# Patient Record
Sex: Male | Born: 1975
Health system: Southern US, Community
[De-identification: ages and names within clinical notes are randomized; demographics above are authoritative.]

## PROBLEM LIST (undated history)

## (undated) DIAGNOSIS — T8859XA Other complications of anesthesia, initial encounter: Secondary | ICD-10-CM

## (undated) DIAGNOSIS — F419 Anxiety disorder, unspecified: Secondary | ICD-10-CM

## (undated) DIAGNOSIS — E785 Hyperlipidemia, unspecified: Secondary | ICD-10-CM

## (undated) DIAGNOSIS — Z9889 Other specified postprocedural states: Secondary | ICD-10-CM

## (undated) DIAGNOSIS — R112 Nausea with vomiting, unspecified: Secondary | ICD-10-CM

## (undated) DIAGNOSIS — E119 Type 2 diabetes mellitus without complications: Secondary | ICD-10-CM

## (undated) DIAGNOSIS — T4145XA Adverse effect of unspecified anesthetic, initial encounter: Secondary | ICD-10-CM

## (undated) DIAGNOSIS — I639 Cerebral infarction, unspecified: Secondary | ICD-10-CM

## (undated) HISTORY — DX: Hyperlipidemia, unspecified: E78.5

## (undated) HISTORY — PX: PILONIDAL CYST EXCISION: SHX744

## (undated) HISTORY — DX: Type 2 diabetes mellitus without complications: E11.9

---

## 1999-04-01 ENCOUNTER — Emergency Department (HOSPITAL_COMMUNITY): Admission: EM | Admit: 1999-04-01 | Discharge: 1999-04-01 | Payer: Self-pay | Admitting: Emergency Medicine

## 1999-04-13 ENCOUNTER — Ambulatory Visit (HOSPITAL_BASED_OUTPATIENT_CLINIC_OR_DEPARTMENT_OTHER): Admission: RE | Admit: 1999-04-13 | Discharge: 1999-04-13 | Payer: Self-pay

## 2003-10-22 ENCOUNTER — Emergency Department (HOSPITAL_COMMUNITY): Admission: EM | Admit: 2003-10-22 | Discharge: 2003-10-22 | Payer: Self-pay | Admitting: Emergency Medicine

## 2004-04-01 ENCOUNTER — Emergency Department (HOSPITAL_COMMUNITY): Admission: EM | Admit: 2004-04-01 | Discharge: 2004-04-01 | Payer: Self-pay | Admitting: Emergency Medicine

## 2015-01-17 DIAGNOSIS — E785 Hyperlipidemia, unspecified: Secondary | ICD-10-CM

## 2015-01-17 DIAGNOSIS — E119 Type 2 diabetes mellitus without complications: Secondary | ICD-10-CM

## 2015-01-17 HISTORY — DX: Hyperlipidemia, unspecified: E78.5

## 2015-01-17 HISTORY — DX: Type 2 diabetes mellitus without complications: E11.9

## 2015-02-06 ENCOUNTER — Emergency Department (HOSPITAL_COMMUNITY): Payer: Medicaid Other

## 2015-02-06 ENCOUNTER — Observation Stay (HOSPITAL_COMMUNITY): Payer: Medicaid Other

## 2015-02-06 ENCOUNTER — Inpatient Hospital Stay (HOSPITAL_COMMUNITY)
Admission: EM | Admit: 2015-02-06 | Discharge: 2015-02-08 | DRG: 065 | Disposition: A | Discharge status: Home / Self Care | Payer: Medicaid Other | Attending: Oncology | Admitting: Oncology

## 2015-02-06 ENCOUNTER — Encounter (HOSPITAL_COMMUNITY): Payer: Self-pay | Admitting: Family Medicine

## 2015-02-06 DIAGNOSIS — E669 Obesity, unspecified: Secondary | ICD-10-CM | POA: Diagnosis present

## 2015-02-06 DIAGNOSIS — I639 Cerebral infarction, unspecified: Principal | ICD-10-CM | POA: Diagnosis present

## 2015-02-06 DIAGNOSIS — Z720 Tobacco use: Secondary | ICD-10-CM | POA: Diagnosis not present

## 2015-02-06 DIAGNOSIS — E785 Hyperlipidemia, unspecified: Secondary | ICD-10-CM | POA: Diagnosis present

## 2015-02-06 DIAGNOSIS — G8194 Hemiplegia, unspecified affecting left nondominant side: Secondary | ICD-10-CM | POA: Diagnosis present

## 2015-02-06 DIAGNOSIS — Z6836 Body mass index (BMI) 36.0-36.9, adult: Secondary | ICD-10-CM

## 2015-02-06 DIAGNOSIS — E1165 Type 2 diabetes mellitus with hyperglycemia: Secondary | ICD-10-CM | POA: Diagnosis present

## 2015-02-06 DIAGNOSIS — G459 Transient cerebral ischemic attack, unspecified: Secondary | ICD-10-CM | POA: Diagnosis present

## 2015-02-06 DIAGNOSIS — IMO0002 Reserved for concepts with insufficient information to code with codable children: Secondary | ICD-10-CM | POA: Diagnosis present

## 2015-02-06 DIAGNOSIS — I679 Cerebrovascular disease, unspecified: Secondary | ICD-10-CM | POA: Diagnosis not present

## 2015-02-06 DIAGNOSIS — R29810 Facial weakness: Secondary | ICD-10-CM | POA: Diagnosis present

## 2015-02-06 DIAGNOSIS — Z8673 Personal history of transient ischemic attack (TIA), and cerebral infarction without residual deficits: Secondary | ICD-10-CM | POA: Diagnosis present

## 2015-02-06 DIAGNOSIS — F17210 Nicotine dependence, cigarettes, uncomplicated: Secondary | ICD-10-CM | POA: Diagnosis present

## 2015-02-06 LAB — COMPREHENSIVE METABOLIC PANEL
ALBUMIN: 4.2 g/dL (ref 3.5–5.2)
ALT: 216 U/L — AB (ref 0–53)
AST: 191 U/L — AB (ref 0–37)
Alkaline Phosphatase: 95 U/L (ref 39–117)
Anion gap: 9 (ref 5–15)
BUN: 7 mg/dL (ref 6–23)
CO2: 29 mmol/L (ref 19–32)
CREATININE: 0.75 mg/dL (ref 0.50–1.35)
Calcium: 9.3 mg/dL (ref 8.4–10.5)
Chloride: 102 mmol/L (ref 96–112)
GFR calc Af Amer: >90 mL/min (ref >90)
GFR calc non Af Amer: >90 mL/min (ref >90)
Glucose, Bld: 125 mg/dL — ABNORMAL HIGH (ref 70–99)
Potassium: 3.9 mmol/L (ref 3.5–5.1)
SODIUM: 140 mmol/L (ref 135–145)
TOTAL PROTEIN: 7.6 g/dL (ref 6.0–8.3)
Total Bilirubin: 1 mg/dL (ref 0.3–1.2)

## 2015-02-06 LAB — APTT: aPTT: 27 seconds (ref 24–37)

## 2015-02-06 LAB — PROTIME-INR
INR: 1.07 (ref 0.00–1.49)
Prothrombin Time: 14.1 seconds (ref 11.6–15.2)

## 2015-02-06 LAB — CBC
HCT: 48.4 % (ref 39.0–52.0)
HEMOGLOBIN: 17.3 g/dL — AB (ref 13.0–17.0)
MCH: 32.4 pg (ref 26.0–34.0)
MCHC: 35.7 g/dL (ref 30.0–36.0)
MCV: 90.6 fL (ref 78.0–100.0)
Platelets: 235 10*3/uL (ref 150–400)
RBC: 5.34 MIL/uL (ref 4.22–5.81)
RDW: 12.7 % (ref 11.5–15.5)
WBC: 10.7 10*3/uL — ABNORMAL HIGH (ref 4.0–10.5)

## 2015-02-06 LAB — I-STAT TROPONIN, ED: TROPONIN I, POC: 0 ng/mL (ref 0.00–0.08)

## 2015-02-06 LAB — DIFFERENTIAL
BASOS ABS: 0.1 10*3/uL (ref 0.0–0.1)
Basophils Relative: 1 % (ref 0–1)
EOS PCT: 3 % (ref 0–5)
Eosinophils Absolute: 0.3 10*3/uL (ref 0.0–0.7)
LYMPHS ABS: 2.5 10*3/uL (ref 0.7–4.0)
LYMPHS PCT: 24 % (ref 12–46)
Monocytes Absolute: 0.7 10*3/uL (ref 0.1–1.0)
Monocytes Relative: 6 % (ref 3–12)
NEUTROS ABS: 7.1 10*3/uL (ref 1.7–7.7)
Neutrophils Relative %: 66 % (ref 43–77)

## 2015-02-06 LAB — CBG MONITORING, ED: Glucose-Capillary: 115 mg/dL — ABNORMAL HIGH (ref 70–99)

## 2015-02-06 LAB — ANTITHROMBIN III: ANTITHROMB III FUNC: 104 % (ref 75–120)

## 2015-02-06 MED ORDER — NICOTINE 14 MG/24HR TD PT24
14.0000 mg | MEDICATED_PATCH | Freq: Every day | TRANSDERMAL | Status: DC
  Administered 2015-02-06 – 2015-02-07 (×2): 14 mg via TRANSDERMAL
  Filled 2015-02-06 (×2): qty 1

## 2015-02-06 MED ORDER — ASPIRIN EC 325 MG PO TBEC
325.0000 mg | DELAYED_RELEASE_TABLET | Freq: Every day | ORAL | Status: DC
  Administered 2015-02-06 – 2015-02-08 (×3): 325 mg via ORAL
  Filled 2015-02-06 (×3): qty 1

## 2015-02-06 MED ORDER — ENOXAPARIN SODIUM 40 MG/0.4ML ~~LOC~~ SOLN
40.0000 mg | SUBCUTANEOUS | Status: DC
  Administered 2015-02-06 – 2015-02-07 (×2): 40 mg via SUBCUTANEOUS
  Filled 2015-02-06 (×2): qty 0.4

## 2015-02-06 NOTE — ED Provider Notes (Signed)
CSN: [id number]     Arrival date & time 02/06/15  1208 History   First MD Initiated Contact with Patient 02/06/15 1255     Chief Complaint  Patient presents with  . Extremity Weakness  . Numbness     (Consider location/radiation/quality/duration/timing/severity/associated sxs/prior Treatment) Patient is a 39 y.o. male presenting with extremity weakness. The history is provided by the patient.  Extremity Weakness This is a new problem. The current episode started yesterday. Episode frequency: Intermittently. The problem has not changed since onset.Pertinent negatives include no chest pain, no abdominal pain and no shortness of breath. Nothing aggravates the symptoms. Nothing relieves the symptoms. He has tried nothing for the symptoms.    History reviewed. No pertinent past medical history. History reviewed. No pertinent past surgical history. History reviewed. No pertinent family history. History  Substance Use Topics  . Smoking status: Current Every Day Smoker  . Smokeless tobacco: Not on file  . Alcohol Use: Yes    Review of Systems  Constitutional: Negative for fever and chills.  Respiratory: Negative for cough and shortness of breath.   Cardiovascular: Negative for chest pain and leg swelling.  Gastrointestinal: Negative for vomiting and abdominal pain.  Musculoskeletal: Positive for extremity weakness.  All other systems reviewed and are negative.     Allergies  Review of patient's allergies indicates no known allergies.  Home Medications   Prior to Admission medications   Not on File   BP 134/83 mmHg  Pulse 90  Temp(Src) 97.5 F (36.4 C)  Resp 16  Wt 235 lb (106.595 kg)  SpO2 95% Physical Exam  Constitutional: He is oriented to person, place, and time. He appears well-developed and well-nourished. No distress.  HENT:  Head: Normocephalic and atraumatic.  Mouth/Throat: Oropharynx is clear and moist. No oropharyngeal exudate.  Eyes: EOM are normal. Pupils  are equal, round, and reactive to light.  Neck: Normal range of motion. Neck supple.  Cardiovascular: Normal rate and regular rhythm.  Exam reveals no friction rub.   No murmur heard. Pulmonary/Chest: Effort normal and breath sounds normal. No respiratory distress. He has no wheezes. He has no rales.  Abdominal: Soft. He exhibits no distension. There is no tenderness. There is no rebound.  Musculoskeletal: Normal range of motion. He exhibits no edema.  Neurological: He is alert and oriented to person, place, and time. A cranial nerve deficit (L sided facial droop) and sensory deficit (L arm, L leg, L face) is present. He exhibits abnormal muscle tone (L arm). Coordination normal. GCS eye subscore is 4. GCS verbal subscore is 5. GCS motor subscore is 6.  Skin: No rash noted. He is not diaphoretic.  Nursing note and vitals reviewed.   ED Course  Procedures (including critical care time) Labs Review Labs Reviewed  CBC - Abnormal; Notable for the following:    WBC 10.7 (*)    Hemoglobin 17.3 (*)    All other components within normal limits  COMPREHENSIVE METABOLIC PANEL - Abnormal; Notable for the following:    Glucose, Bld 125 (*)    AST 191 (*)    ALT 216 (*)    All other components within normal limits  PROTIME-INR  APTT  DIFFERENTIAL  I-STAT TROPOININ, ED  CBG MONITORING, ED    Imaging Review Ct Head (brain) Wo Contrast  02/06/2015   CLINICAL DATA:  Left-sided weakness and numbness  EXAM: CT HEAD WITHOUT CONTRAST  TECHNIQUE: Contiguous axial images were obtained from the base of the skull through the vertex  without intravenous contrast.  COMPARISON:  None.  FINDINGS: Bony calvarium is intact. Mucosal retention cysts are noted within the maxillary antra bilaterally. Changes consistent with prior lacunar infarcts are noted in the midline of the pons as well as the right side of the pons. These would correspond with the patient's given clinical history of prior stroke-like symptoms. No  findings to suggest acute hemorrhage, acute infarction or space-occupying mass lesion are noted.  IMPRESSION: Chronic changes as described.  No acute abnormality is noted.   Electronically Signed   By: Alcide CleverMark  Lukens M.D.   On: 02/06/2015 13:28     EKG Interpretation   Date/Time:  Monday February 06 2015 12:13:13 EDT Ventricular Rate:  108 PR Interval:  158 QRS Duration: 78 QT Interval:  326 QTC Calculation: 436 R Axis:   12 Text Interpretation:  Sinus tachycardia Cannot rule out Anterior infarct ,  age undetermined Abnormal ECG No prior for comparison Confirmed by Gwendolyn GrantWALDEN   MD, Travell Desaulniers [phone]) on 02/06/2015 3:33:16 PM      MDM   Final diagnoses:  Transient cerebral ischemia, unspecified transient cerebral ischemia type  Facial droop    39 year old male without any medical problems comes in with symptoms concerning for TIA. He's had intermittent left-sided weakness and left facial droop since yesterday. No pain. No vision issues. No headache. Here vitals are stable. He has left arm weakness. He also has some left-sided facial droop. Sensation is altered on the left leg, left arm, left face. CT head is normal to me, radiology read not yet available. We'll plan for admission, MRI.    Elwin MochaBlair Amarian Botero, MD 02/06/15 1534

## 2015-02-06 NOTE — H&P (Signed)
Date: 02/06/2015               Patient Name:  Bobby Thompson MRN: [id number]  DOB: 09/18/1976 Age / Sex: 39 y.o., male   PCP: No primary care provider on file.         Medical Service: Internal Medicine Teaching Service         Attending Physician: Dr. Levert FeinsteinJames M Granfortuna, MD    First Contact: Tyron RussellKatherine McLaughlin Pager: [phone]  Second Contact: Dr. Mariea ClontsEmokpae Pager: [phone]       After Hours (After 5p/  First Contact Pager: [phone]  weekends / holidays): Second Contact Pager: [phone]   Chief Complaint: Left sided weakness and numbness  History of Present Illness: 39 Y O M with no Past medical history, presented to the Ed today with complaints of left sided numbnes and weakness that started yesterday afternoon, patient says this started with a headache, then progressed to lefts sided facial numbness, with left sided weakness of his upper then his lower extremity. Significant other present at bedside says pt also had slurred speech and left sided facial droop, both of which have improved but still persistent.  Pt denies having difficulty swallowing, as he date last night after symptoms started without any difficulty or choking. He has not had a problems with his gait or walking.  Pt endorsing having similar symptoms similar to the in the past- 2 years ago. He says symptoms where much worse, and involved both sides, but resolved with a few weeks. He does not have a Primary care provider, never seen a doctor, never been told he might have HTN or DM, no hx of blood clots. He denies family hx of premature heart attacks or strokes, but says his father has DM.  Pt also endorses having chest pain- 2 weeks ago, lasted for about 5 hours, constant pressure, without associated SOB, diaphoresis. Also says that he was working at work- works in Production designer, theatre/television/filmMaintenance at [employer]. Pt denies fever, chills, Cough, or SOB. He does not take an aspirin or any other medications. Pt smokes Cigarettes- 3 three- quarter  to a pack per day everyday, for   He denies drug use, and also drinks alcohol 6-8 beers about 3 times a week.  Meds: No current facility-administered medications for this encounter.   No current outpatient prescriptions on file.    Allergies: Allergies as of 02/06/2015  . (No Known Allergies)   History reviewed. No pertinent past medical history. History reviewed. No pertinent past surgical history. History reviewed. No pertinent family history. History   Social History  . Marital Status: Single    Spouse Name: N/A  . Number of Children: N/A  . Years of Education: N/A   Occupational History  . Not on file.   Social History Main Topics  . Smoking status: Current Every Day Smoker  . Smokeless tobacco: Not on file  . Alcohol Use: Yes  . Drug Use: No  . Sexual Activity: Not on file   Other Topics Concern  . Not on file   Social History Narrative  . No narrative on file    Review of Systems: CONSTITUTIONAL- No Fever, or weightloss SKIN- No Rash, colour changes or itching. HEAD- Has a Headache, had some dizziness, but no falls. RESPIRATORY- No Cough or SOB. CARDIAC- No Palpitations, DOE, PND or chest pain. GI- No nausea, vomiting, diarrhoea, constipation, abd pain. URINARY- No Frequency, dysuria. NEUROLOGIC- No Numbness, syncope, seizures or burning.  Physical Exam: Blood pressure 115/60, pulse  83, temperature 97.6 F (36.4 C), resp. rate 17, weight 235 lb (106.595 kg), SpO2 97 %. GENERAL- alert, co-operative, appears as stated age, not in any distress. HEENT- Atraumatic, normocephalic, PERRL, EOMI, oral mucosa appears moist, tongue central, no deviation, neck supple, no carotid bruit appreciated CARDIAC- RRR, no murmurs, rubs or gallops. RESP- Moving equal volumes of air, and clear to auscultation bilaterally, no wheezes or crackles. ABDOMEN- Soft, nontender, no guarding or rebound, no palpable masses or organomegaly, bowel sounds present. BACK- Normal curvature  of the spine, No tenderness along the vertebrae, no CVA tenderness, has some skin tags around neck region. NEURO- Sensation impaired left side of face, arm and leg, but endorses reduced hearing left, Uvular could not be seen, due to pt body habitus, other cranial nerve testing unremarkable, strength upper and lower extremity, mostly 5/5 except for grip strenght- +4/5 on left, Sensation reduced on left, upper extremity, finger to nose intact, rapid alternating movement intact, heel to chin intact. EXTREMITIES- pulse 2+, symmetric, no pedal edema. SKIN- Warm, dry, No rash or lesion. PSYCH- Normal mood and affect, appropriate thought content and speech.  Lab results: Basic Metabolic Panel:  Recent Labs  40/98/11 1224  NA 140  K 3.9  CL 102  CO2 29  GLUCOSE 125*  BUN 7  CREATININE 0.75  CALCIUM 9.3   Liver Function Tests:  Recent Labs  02/06/15 1224  AST 191*  ALT 216*  ALKPHOS 95  BILITOT 1.0  PROT 7.6  ALBUMIN 4.2   CBC:  Recent Labs  02/06/15 1224  WBC 10.7*  NEUTROABS 7.1  HGB 17.3*  HCT 48.4  MCV 90.6  PLT 235   CBG:  Recent Labs  02/06/15 1412  GLUCAP 115*   Coagulation:  Recent Labs  02/06/15 1224  LABPROT 14.1  INR 1.07   Urine Drug Screen: Drugs of Abuse  No results found for: LABOPIA, COCAINSCRNUR, LABBENZ, AMPHETMU, THCU, LABBARB   Imaging results:  Ct Head (brain) Wo Contrast  02/06/2015   CLINICAL DATA:  Left-sided weakness and numbness  EXAM: CT HEAD WITHOUT CONTRAST  TECHNIQUE: Contiguous axial images were obtained from the base of the skull through the vertex without intravenous contrast.  COMPARISON:  None.  FINDINGS: Bony calvarium is intact. Mucosal retention cysts are noted within the maxillary antra bilaterally. Changes consistent with prior lacunar infarcts are noted in the midline of the pons as well as the right side of the pons. These would correspond with the patient's given clinical history of prior stroke-like symptoms. No  findings to suggest acute hemorrhage, acute infarction or space-occupying mass lesion are noted.  IMPRESSION: Chronic changes as described.  No acute abnormality is noted.   Electronically Signed   By: Alcide Clever M.D.   On: 02/06/2015 13:28   Other results: EKG: Rate108bpm, sinus rhythm, regular, PR- 158 ,insignificant Q waves-  lateral leads, QTc- 436, has some J point elevation, but St and t waves basically unremarkable.  Assessment & Plan by Problem: Active Problems:   TIA (transient ischemic attack)  TIA (transient ischemic attack)- Possible TIA as most of pts symptoms have resolved, but with mild persistent paraesthesia. Not TPA candidate with resolving deficits and out side TPA window. Risk factors- So far Obesity and Smoking.  CT shows evidence of prior lacunar infarct- mildline of pons, and right side, without any acute abnormality. Given patient age, and prior history of CVA, will pursue extensive workup including hypercoagulable panel. - Admit to tele, in patient -MRI/MRA head -TEE -fasting  lipid panel -Hga1C -telemetry - Echo -carotid artery dopplers  - Hypercoagulable panel- homocysteine, Antiphospholipid, Anthithrombin, protein c, protein s, total and activity, Lupus anticoag, antithrombin 3, B2 glycoprotein, factor 5 leiden.  -PT/OT consult - Regular diet after until passes RN bed side swallow - Aspirin  daily - Neuro consult- appreciate recs  Tobacco use:  - Nicotine patch -Counselled on cessation  DVT prophylaxis -lovenox  Dispo: Disposition is deferred at this time, awaiting improvement of current medical problems. Anticipated discharge in approximately 2-3 day(s).   The patient does not have a current PCP (No primary care provider on file.) and does need an [facility] hospital follow-up appointment after discharge.  The patient does not have transportation limitations that hinder transportation to clinic appointments.  Signed: Onnie Boer,  MD 02/06/2015, 3:16 PM

## 2015-02-06 NOTE — ED Notes (Signed)
Pt here for left sided numbness and weakness that started yesterday. Pt weaker grip strength on the left and left sided facial droop.

## 2015-02-06 NOTE — H&P (Signed)
Date: 02/06/2015               Patient Name:  Bobby Thompson MRN: [id number]  DOB: 10/29/1976 Age / Sex: 39 y.o., male   PCP: No primary care provider on file.              Medical Service: Internal Medicine Teaching Service              Attending Physician: Dr. Levert FeinsteinJames M Granfortuna, MD    First Contact: Laurey ArrowKatherine Roemer, MS 4 Pager: [phone]  Second Contact: Dr. Mariea ClontsEmokpae Pager: [phone]  Third Contact Dr. Senaida Oresichardson Pager: [phone]       After Hours (After 5p/  First Contact Pager: [phone]  weekends / holidays): Second Contact Pager: [phone]   Chief Complaint: Left sided weakness  History of Present Illness: Patient is a 39 yo man with no past medical history who presents to the ED with a 1 day history of left face, arm, and leg weakness and numbness. Patient states that these symptoms started yesterday morning and were preceded by a headache. Patient describes this headache as "feeling like his head was swollen", and it resolved yesterday. Patient describes weakness and numbness starting at his face and progression down his L arm and leg. He also reports feeling very dizzy, but denies any falls. His wife noticed L facial droop this morning and a change in his speech, although she did not think it was severe enough to be called slurring. He was able to walk and went to work yesterday, but he did note trouble using his left arm. Since yesterday his symptoms have improved, but he reports continued L arm and face weakness and dizziness. Of note, patient describes a similar incident about 2-3 years ago when he had HA, whole body weakness, and dizziness. He did not seek medical care at this time, and reports his symptoms resolved after 1 week.   Patient did had an episode of chest pain and pressure last week, lasting 6 hours and resolving. He denies and sweating or SOB associated with the CP. Patient has had CP once before and reports it was concurrent with his prior episode of body weakness. He  does not have CP with exertion and he is able to climb stairs without difficulty. He does not have a PCP and the last time he saw a doctor was 2 years ago to have a physical exam. He does not take daily aspirin.   Meds: No current facility-administered medications for this encounter.   No current outpatient prescriptions on file.    Allergies: Allergies as of 02/06/2015  . (No Known Allergies)   History reviewed. No pertinent past medical history. History reviewed. No pertinent past surgical history. History reviewed. No pertinent family history. History   Social History  . Marital Status: Single    Spouse Name: N/A  . Number of Children: N/A  . Years of Education: N/A   Occupational History  . Not on file.   Social History Main Topics  . Smoking status: Current Every Day Smoker  . Smokeless tobacco: Not on file  . Alcohol Use: Yes  . Drug Use: No  . Sexual Activity: Not on file   Other Topics Concern  . Not on file   Social History Narrative  . No narrative on file    Review of Systems: Ears, nose, mouth, throat, and face: negative for hearing loss and trouble swallowing Gastrointestinal: negative for abdominal pain and change in bowel habits Genitourinary:negative for trouble  urinating Neurological: positive for dizziness, headaches and weakness, negative for gait problems  Physical Exam: Blood pressure 134/83, pulse 90, temperature 97.5 F (36.4 C), resp. rate 16, weight 106.595 kg (235 lb), SpO2 95 %.  HEENT- Normocephalic. EOM intact. Pupils equal and reactive to light. Cardiovascular- S1, S2 normal, no murmurs appreciated Lungs- CTA bilaterally  Abdomen- soft, non-tender. Normal bowel sounds.  Extremities- no edema Musculoskeletal-no joint tenderness, deformity or swelling Skin-warm and dry,acanthosis nigricans in the neck folds  Neuro: Alert and oriented x 3. 4+ strength LUE and 5 strength in RUE. 5/5 strength in bilateral lower extremities. Slight L  facial droop appreciated when asking patient to smile. Decreased sensation in L face, arm, and leg as compared to right side. Babinski negative bilaterally. Normal finger to nose bilaterally and normal heel-shin test bilaterally.   Lab results: Basic Metabolic Panel:  Recent Labs  16/10/96 1224  NA 140  K 3.9  CL 102  CO2 29  GLUCOSE 125*  BUN 7  CREATININE 0.75  CALCIUM 9.3   Liver Function Tests:  Recent Labs  02/06/15 1224  AST 191*  ALT 216*  ALKPHOS 95  BILITOT 1.0  PROT 7.6  ALBUMIN 4.2   No results for input(s): LIPASE, AMYLASE in the last 72 hours. No results for input(s): AMMONIA in the last 72 hours. CBC:  Recent Labs  02/06/15 1224  WBC 10.7*  NEUTROABS 7.1  HGB 17.3*  HCT 48.4  MCV 90.6  PLT 235   Cardiac Enzymes: No results for input(s): CKTOTAL, CKMB, CKMBINDEX, TROPONINI in the last 72 hours. BNP: No results for input(s): PROBNP in the last 72 hours. D-Dimer: No results for input(s): DDIMER in the last 72 hours. CBG: No results for input(s): GLUCAP in the last 72 hours. Hemoglobin A1C: No results for input(s): HGBA1C in the last 72 hours. Fasting Lipid Panel: No results for input(s): CHOL, HDL, LDLCALC, TRIG, CHOLHDL, LDLDIRECT in the last 72 hours. Thyroid Function Tests: No results for input(s): TSH, T4TOTAL, FREET4, T3FREE, THYROIDAB in the last 72 hours. Anemia Panel: No results for input(s): VITAMINB12, FOLATE, FERRITIN, TIBC, IRON, RETICCTPCT in the last 72 hours. Coagulation:  Recent Labs  02/06/15 1224  LABPROT 14.1  INR 1.07   Urine Drug Screen: Drugs of Abuse  No results found for: LABOPIA, COCAINSCRNUR, LABBENZ, AMPHETMU, THCU, LABBARB  Alcohol Level: No results for input(s): ETH in the last 72 hours. Urinalysis: No results for input(s): COLORURINE, LABSPEC, PHURINE, GLUCOSEU, HGBUR, BILIRUBINUR, KETONESUR, PROTEINUR, UROBILINOGEN, NITRITE, LEUKOCYTESUR in the last 72 hours.  Invalid input(s):  APPERANCEUR   Imaging results:  Ct Head (brain) Wo Contrast  02/06/2015   CLINICAL DATA:  Left-sided weakness and numbness  EXAM: CT HEAD WITHOUT CONTRAST  TECHNIQUE: Contiguous axial images were obtained from the base of the skull through the vertex without intravenous contrast.  COMPARISON:  None.  FINDINGS: Bony calvarium is intact. Mucosal retention cysts are noted within the maxillary antra bilaterally. Changes consistent with prior lacunar infarcts are noted in the midline of the pons as well as the right side of the pons. These would correspond with the patient's given clinical history of prior stroke-like symptoms. No findings to suggest acute hemorrhage, acute infarction or space-occupying mass lesion are noted.  IMPRESSION: Chronic changes as described.  No acute abnormality is noted.   Electronically Signed   By: Alcide Clever M.D.   On: 02/06/2015 13:28    Other results: EKG (3/21): sinus tachycardia, possible anterior infarct of undetermined age. No prior  ekg in system for comparison  Assessment & Plan by Problem: Active Problems:   TIA (transient ischemic attack)  L. Sided Weakness and Numbness: CT shows evidence of prior lacunar infarct, without any acute abnormality. Symptoms are consistent with ischemic stroke, we will proceed with a stroke workup. Given patient's young age it is possible he has a PFO that predisposes him to embolic strokes. -MRI/MRA head -TEE -fasting lipid panel -a1C -telemetry -carotid artery dopplers  - Hypercoagulable panel -PT/OT consult - NPO until passes swallow study - Aspirin  daily - Neuro consult  Tobacco use:  nicotine patch -counsel on cessation  DVT prophylaxis -lovenox  Dispo: Anticipated discharge tomorrow    This is a Psychologist, occupational Note.  The care of the patient was discussed with Dr. Mariea Clonts and the assessment and plan was formulated with their assistance.  Please see their note for official documentation of the patient  encounter.   Signed: Juanetta Gosling, Med Student 02/06/2015, 1:52 PM

## 2015-02-06 NOTE — Consult Note (Signed)
Referring Physician: Cyndie ChimeGranfortuna    Chief Complaint: left sided decreased sensation  HPI:                                                                                                                                         Bobby Thompson is an 39 y.o. male with no significant PMHx other than tobacco abuse. Patient noted yesterday he had new onset of decreased sensation in his left leg, arm and face.  HE did not seek medical attention until today when he noted the symptoms did not dissipate.  He states he has had similar symptoms in the past but never sought medical attention. He does not take ASA daily.   Date last known well: Date: 02/05/2015 Time last known well: Unable to determine tPA Given: No: out of window Modified Rankin: Rankin Score=0    Past medical history--none.  History reviewed. No pertinent past surgical history.  Family history: Negative for stroke as well as family members with significant risk factors for stroke.  Social History:  reports that he has been smoking.  He does not have any smokeless tobacco history on file. He reports that he drinks alcohol. He reports that he does not use illicit drugs.  Allergies: No Known Allergies  Medications:                                                                                                                           No current facility-administered medications for this encounter.   Current Outpatient Prescriptions  Medication Sig Dispense Refill  . ibuprofen (ADVIL,MOTRIN) 200 MG tablet Take 800 mg by mouth daily as needed for headache, mild pain or moderate pain.       ROS:  History obtained from the patient  General ROS: negative for - chills, fatigue, fever, night sweats, weight gain or weight loss Psychological ROS: negative for - behavioral disorder, hallucinations,  memory difficulties, mood swings or suicidal ideation Ophthalmic ROS: negative for - blurry vision, double vision, eye pain or loss of vision ENT ROS: negative for - epistaxis, nasal discharge, oral lesions, sore throat, tinnitus or vertigo Allergy and Immunology ROS: negative for - hives or itchy/watery eyes Hematological and Lymphatic ROS: negative for - bleeding problems, bruising or swollen lymph nodes Endocrine ROS: negative for - galactorrhea, hair pattern changes, polydipsia/polyuria or temperature intolerance Respiratory ROS: negative for - cough, hemoptysis, shortness of breath or wheezing Cardiovascular ROS: negative for - chest pain, dyspnea on exertion, edema or irregular heartbeat Gastrointestinal ROS: negative for - abdominal pain, diarrhea, hematemesis, nausea/vomiting or stool incontinence Genito-Urinary ROS: negative for - dysuria, hematuria, incontinence or urinary frequency/urgency Musculoskeletal ROS: negative for - joint swelling or muscular weakness Neurological ROS: as noted in HPI Dermatological ROS: negative for rash and skin lesion changes  Neurologic Examination:                                                                                                      Blood pressure 111/57, pulse 89, temperature 97.6 F (36.4 C), resp. rate 17, weight 106.595 kg (235 lb), SpO2 93 %.  HEENT-  Normocephalic, no lesions, without obvious abnormality.  Normal external eye and conjunctiva.  Normal TM's bilaterally.  Normal auditory canals and external ears. Normal external nose, mucus membranes and septum.  Normal pharynx. Cardiovascular- S1, S2 normal, pulses palpable throughout   Lungs- chest clear, no wheezing, rales, normal symmetric air entry Abdomen- normal findings: bowel sounds normal Extremities- no edema Lymph-no adenopathy palpable Musculoskeletal-no joint tenderness, deformity or swelling Skin-warm and dry, no hyperpigmentation, vitiligo, or suspicious  lesions  Neurological Examination Mental Status: Alert, oriented, thought content appropriate.  Speech fluent without evidence of aphasia.  Able to follow 3 step commands without difficulty. Cranial Nerves: II: Discs flat bilaterally; Visual fields grossly normal, pupils equal, round, reactive to light and accommodation III,IV, VI: ptosis not present, extra-ocular motions intact bilaterally V,VII: smile asymmetric on the left, facial light touch sensation decreased on the left VIII: hearing normal bilaterally IX,X: uvula rises symmetrically XI: bilateral shoulder shrug XII: midline tongue extension Motor: Right : Upper extremity   5/5    Left:     Upper extremity   5/5  Lower extremity   5/5     Lower extremity   5/5 Tone and bulk:normal tone throughout; no atrophy noted Sensory: Pinprick and light touch decreased on the left Deep Tendon Reflexes: 2+ and symmetric throughout Plantars: Right: downgoing   Left: downgoing Cerebellar: normal finger-to-nose,  and normal heel-to-shin test Gait: Not tested due to safety       Lab Results: Basic Metabolic Panel:  Recent Labs Lab 02/06/15 1224  NA 140  K 3.9  CL 102  CO2 29  GLUCOSE 125*  BUN 7  CREATININE 0.75  CALCIUM 9.3    Liver Function Tests:  Recent Labs Lab 02/06/15 1224  AST 191*  ALT 216*  ALKPHOS 95  BILITOT 1.0  PROT 7.6  ALBUMIN 4.2   No results for input(s): LIPASE, AMYLASE in the last 168 hours. No results for input(s): AMMONIA in the last 168 hours.  CBC:  Recent Labs Lab 02/06/15 1224  WBC 10.7*  NEUTROABS 7.1  HGB 17.3*  HCT 48.4  MCV 90.6  PLT 235    Cardiac Enzymes: No results for input(s): CKTOTAL, CKMB, CKMBINDEX, TROPONINI in the last 168 hours.  Lipid Panel: No results for input(s): CHOL, TRIG, HDL, CHOLHDL, VLDL, LDLCALC in the last 168 hours.  CBG:  Recent Labs Lab 02/06/15 1412  GLUCAP 115*    Microbiology: No results found for this or any previous  visit.  Coagulation Studies:  Recent Labs  02/06/15 1224  LABPROT 14.1  INR 1.07    Imaging: Ct Head (brain) Wo Contrast  02/06/2015   CLINICAL DATA:  Left-sided weakness and numbness  EXAM: CT HEAD WITHOUT CONTRAST  TECHNIQUE: Contiguous axial images were obtained from the base of the skull through the vertex without intravenous contrast.  COMPARISON:  None.  FINDINGS: Bony calvarium is intact. Mucosal retention cysts are noted within the maxillary antra bilaterally. Changes consistent with prior lacunar infarcts are noted in the midline of the pons as well as the right side of the pons. These would correspond with the patient's given clinical history of prior stroke-like symptoms. No findings to suggest acute hemorrhage, acute infarction or space-occupying mass lesion are noted.  IMPRESSION: Chronic changes as described.  No acute abnormality is noted.   Electronically Signed   By: Alcide Clever M.D.   On: 02/06/2015 13:28       Assessment and plan discussed with with attending physician and they are in agreement.    Felicie Morn PA-C [facility] [phone]  02/06/2015, 2:34 PM   Assessment: 39 y.o. male presenting with new onset left sided decreased sensation for <24 hours. Neuro exam shows decreased sensation on the left face, arm and leg. CT head shows no acute stroke but does show bilateral old pontine infarcts.   Stroke Risk Factors - smoking   Recommend: 1. HgbA1c, fasting lipid panel 2. MRI, MRA  of the brain without contrast 3. PT consult, OT consult, Speech consult 4. Echocardiogram 5. Carotid dopplers 6. Prophylactic therapy-Antiplatelet med: Aspirin - dose 325 mg daily 7. Risk factor modification 8. Telemetry monitoring 9. Frequent neuro checks 10 NPO until passes stroke swallow screen 11 hyper coagulable panel.   I personally participated in this patient's evaluation and management, including formulating the above clinical impression and management  recommendations.  Venetia Maxon M.D. [facility] [phone]

## 2015-02-06 NOTE — ED Notes (Signed)
CBG 119 

## 2015-02-07 DIAGNOSIS — I679 Cerebrovascular disease, unspecified: Secondary | ICD-10-CM

## 2015-02-07 DIAGNOSIS — I639 Cerebral infarction, unspecified: Principal | ICD-10-CM

## 2015-02-07 LAB — LIPID PANEL
Cholesterol: 182 mg/dL (ref 0–200)
HDL: 25 mg/dL — AB (ref >39)
LDL CALC: 80 mg/dL (ref 0–99)
Total CHOL/HDL Ratio: 7.3 RATIO
Triglycerides: 387 mg/dL — ABNORMAL HIGH (ref <150)
VLDL: 77 mg/dL — ABNORMAL HIGH (ref 0–40)

## 2015-02-07 MED ORDER — PERFLUTREN LIPID MICROSPHERE
1.0000 mL | INTRAVENOUS | Status: AC | PRN
Start: 2015-02-07 — End: 2015-02-07
  Administered 2015-02-07: 3 mL via INTRAVENOUS
  Filled 2015-02-07: qty 10

## 2015-02-07 MED ORDER — ATORVASTATIN CALCIUM 10 MG PO TABS
20.0000 mg | ORAL_TABLET | Freq: Every day | ORAL | Status: DC
  Administered 2015-02-07: 20 mg via ORAL
  Filled 2015-02-07: qty 2

## 2015-02-07 NOTE — Progress Notes (Signed)
Subjective: Patient continued to have L sided numbness and residual weakness. Has not be up and out of bed other than to go to the bathroom. NAEO.   Objective: Vital signs in last 24 hours: Filed Vitals:   02/07/15 0130 02/07/15 0330 02/07/15 0530 02/07/15 0730  BP: 106/62 111/60 117/70 112/60  Pulse: 73 78 73 80  Temp: 97.9 F (36.6 C) 98.1 F (36.7 C) 97.9 F (36.6 C) 98.4 F (36.9 C)  TempSrc: Oral Oral Oral Oral  Resp: 18 18 18 16   Height:      Weight:      SpO2: 97% 96% 98% 96%   Weight change:   Intake/Output Summary (Last 24 hours) at 02/07/15 0809 Last data filed at 02/06/15 2030  Gross per 24 hour  Intake    240 ml  Output      0 ml  Net    240 ml   GENERAL- alert, co-operative, appears as stated age, not in any distress. HEENT- Atraumatic, normocephalic, PERRL, EOMI, oral mucosa appears moist, tongue central, no deviation, neck supple, no carotid bruit appreciated CARDIAC- RRR, no murmurs, rubs or gallops. RESP- Moving equal volumes of air, and clear to auscultation bilaterally, no wheezes or crackles. ABDOMEN- Soft, nontender, no guarding or rebound, no palpable masses or organomegaly, bowel sounds present. BACK- Normal curvature of the spine, No tenderness along the vertebrae, no CVA tenderness, has some skin tags around neck region. NEURO- Sensation impaired left side of face, arm and leg, but endorses reduced hearing left, Uvular could not be seen, due to pt body habitus, other cranial nerve testing unremarkable, strength upper and lower extremity, mostly 5/5 except for grip strenght- +4/5 on left, Sensation reduced on left, upper extremity, finger to nose intact, rapid alternating movement intact, heel to chin intact. EXTREMITIES- pulse 2+, symmetric, no pedal edema. SKIN- Warm, dry, No rash or lesion. PSYCH- Normal mood and affect, appropriate thought content and speech.  Lab Results: Basic Metabolic Panel:  Recent Labs  16/10/96 1224  NA 140  K 3.9    CL 102  CO2 29  GLUCOSE 125*  BUN 7  CREATININE 0.75  CALCIUM 9.3   Liver Function Tests:  Recent Labs  02/06/15 1224  AST 191*  ALT 216*  ALKPHOS 95  BILITOT 1.0  PROT 7.6  ALBUMIN 4.2   CBC:  Recent Labs  02/06/15 1224  WBC 10.7*  NEUTROABS 7.1  HGB 17.3*  HCT 48.4  MCV 90.6  PLT 235   CBG:  Recent Labs  02/06/15 1412  GLUCAP 115*   Hemoglobin A1C: No results for input(s): HGBA1C in the last 72 hours. Fasting Lipid Panel:  Recent Labs  02/07/15 0700  CHOL 182  HDL 25*  LDLCALC 80  TRIG 045*  CHOLHDL 7.3  Coagulation:  Recent Labs  02/06/15 1224  LABPROT 14.1  INR 1.07   Urinalysis: No results for input(s): COLORURINE, LABSPEC, PHURINE, GLUCOSEU, HGBUR, BILIRUBINUR, KETONESUR, PROTEINUR, UROBILINOGEN, NITRITE, LEUKOCYTESUR in the last 72 hours.  Invalid input(s): APPERANCEUR   Micro Results: No results found for this or any previous visit (from the past 240 hour(s)). Studies/Results: Ct Head (brain) Wo Contrast  02/06/2015   CLINICAL DATA:  Left-sided weakness and numbness  EXAM: CT HEAD WITHOUT CONTRAST  TECHNIQUE: Contiguous axial images were obtained from the base of the skull through the vertex without intravenous contrast.  COMPARISON:  None.  FINDINGS: Bony calvarium is intact. Mucosal retention cysts are noted within the maxillary antra bilaterally. Changes consistent with  prior lacunar infarcts are noted in the midline of the pons as well as the right side of the pons. These would correspond with the patient's given clinical history of prior stroke-like symptoms. No findings to suggest acute hemorrhage, acute infarction or space-occupying mass lesion are noted.  IMPRESSION: Chronic changes as described.  No acute abnormality is noted.   Electronically Signed   By: Alcide Clever M.D.   On: 02/06/2015 13:28   Mr [name] Head Wo Contrast  02/06/2015   CLINICAL DATA:  39 year old male who awoke with left side numbness today. TIA. Initial  encounter.  EXAM: MRI HEAD WITHOUT CONTRAST  MRA HEAD WITHOUT CONTRAST  TECHNIQUE: Multiplanar, multiecho pulse sequences of the brain and surrounding structures were obtained without intravenous contrast. Angiographic images of the head were obtained using MRA technique without contrast.  COMPARISON:  CT head without contrast 1250 hours today.  FINDINGS: MRI HEAD FINDINGS  There is confluent restricted diffusion in the right paracentral pons with corresponding T2 and FLAIR hyperintensity. This is just lateral and cephalad to a chronic more central pontine lacunar infarct. No associated hemorrhage or significant mass effect.  No other restricted diffusion. Major intracranial vascular flow voids are preserved. However, there is enlargement of the basilar artery tip measuring up to 6-7 mm. See MRA findings below.  No other posterior fossa chronic ischemic disease. Supratentorial gray and white matter signal is within normal limits. No midline shift, mass effect, evidence of mass lesion, ventriculomegaly, extra-axial collection or acute intracranial hemorrhage. Cervicomedullary junction and pituitary are within normal limits. Negative visualized cervical spine.  Visible internal auditory structures appear normal. Mastoids are clear. Left greater than right maxillary sinus mucous retention cyst. Visualized orbit soft tissues are within normal limits. Negative scalp soft tissues. Bone marrow signal within normal limits.  MRA HEAD FINDINGS  Antegrade flow in the distal vertebral arteries, the left is mildly dominant. The distal right vertebral artery is diminutive beyond the patent right PICA origin. No distal vertebral artery stenosis. Patent vertebrobasilar junction. Mild basilar artery irregularity with no stenosis.  Heterogeneous flow signal within the distal basilar artery compatible with an up to 8 mm basilar tip aneurysm involving the PCA origins, more so the left. Posterior communicating arteries are diminutive or  absent. Bilateral PCA branches remain within normal limits.  Antegrade flow in both ICA siphons. No siphon stenosis. Ophthalmic artery origins are within normal limits. Normal carotid termini, MCA and ACA origins. Anterior communicating artery and visualized ACA branches within normal limits. Visualized bilateral MCA branches are within normal limits.  IMPRESSION: 1. Acute on chronic brainstem ischemia; Acute right paracentral pons lacunar infarct. No associated mass effect or hemorrhage. 2. Basilar tip aneurysm, 8 mm. Mild basilar artery irregularity elsewhere, no posterior circulation stenosis or major circle of Willis branch occlusion identified.   Electronically Signed   By: Odessa Fleming M.D.   On: 02/06/2015 18:21   Mr Brain Wo Contrast  02/06/2015   CLINICAL DATA:  39 year old male who awoke with left side numbness today. TIA. Initial encounter.  EXAM: MRI HEAD WITHOUT CONTRAST  MRA HEAD WITHOUT CONTRAST  TECHNIQUE: Multiplanar, multiecho pulse sequences of the brain and surrounding structures were obtained without intravenous contrast. Angiographic images of the head were obtained using MRA technique without contrast.  COMPARISON:  CT head without contrast 1250 hours today.  FINDINGS: MRI HEAD FINDINGS  There is confluent restricted diffusion in the right paracentral pons with corresponding T2 and FLAIR hyperintensity. This is just lateral and cephalad to  a chronic more central pontine lacunar infarct. No associated hemorrhage or significant mass effect.  No other restricted diffusion. Major intracranial vascular flow voids are preserved. However, there is enlargement of the basilar artery tip measuring up to 6-7 mm. See MRA findings below.  No other posterior fossa chronic ischemic disease. Supratentorial gray and white matter signal is within normal limits. No midline shift, mass effect, evidence of mass lesion, ventriculomegaly, extra-axial collection or acute intracranial hemorrhage. Cervicomedullary  junction and pituitary are within normal limits. Negative visualized cervical spine.  Visible internal auditory structures appear normal. Mastoids are clear. Left greater than right maxillary sinus mucous retention cyst. Visualized orbit soft tissues are within normal limits. Negative scalp soft tissues. Bone marrow signal within normal limits.  MRA HEAD FINDINGS  Antegrade flow in the distal vertebral arteries, the left is mildly dominant. The distal right vertebral artery is diminutive beyond the patent right PICA origin. No distal vertebral artery stenosis. Patent vertebrobasilar junction. Mild basilar artery irregularity with no stenosis.  Heterogeneous flow signal within the distal basilar artery compatible with an up to 8 mm basilar tip aneurysm involving the PCA origins, more so the left. Posterior communicating arteries are diminutive or absent. Bilateral PCA branches remain within normal limits.  Antegrade flow in both ICA siphons. No siphon stenosis. Ophthalmic artery origins are within normal limits. Normal carotid termini, MCA and ACA origins. Anterior communicating artery and visualized ACA branches within normal limits. Visualized bilateral MCA branches are within normal limits.  IMPRESSION: 1. Acute on chronic brainstem ischemia; Acute right paracentral pons lacunar infarct. No associated mass effect or hemorrhage. 2. Basilar tip aneurysm, 8 mm. Mild basilar artery irregularity elsewhere, no posterior circulation stenosis or major circle of Willis branch occlusion identified.   Electronically Signed   By: Odessa FlemingH  Hall M.D.   On: 02/06/2015 18:21   Medications: I have reviewed the patient's current medications. Scheduled Meds: . aspirin EC  325 mg Oral Daily  . enoxaparin (LOVENOX) injection  40 mg Subcutaneous Q24H  . nicotine  14 mg Transdermal Daily   Continuous Infusions:  PRN Meds:. Assessment/Plan: Principal Problem:   CVA (cerebral vascular accident) Active Problems:   TIA (transient  ischemic attack)   Stroke TIA (transient ischemic attack)- Possible TIA as most of pts symptoms have resolved, but with mild persistent paraesthesia. Not TPA candidate with resolving deficits and out side TPA window. Risk factors- So far Obesity and Smoking. CT shows evidence of prior lacunar infarct- mildline of pons, and right side, without any acute abnormality. MRI/MRA with evidence of acute pontine infart and basilar artery dilatation. Given patient age, and prior history of CVA, will pursue extensive workup including hypercoagulable panel. WIth Hb of 17.3 will consider P vera and other myeloproliferative diseases.  -TEE today -fasting lipid panel: Try 387, LDL 80; tot chol 182 -Hga1C pending -telemetry -carotid artery dopplers  - Hypercoagulable panel- homocysteine, Antiphospholipid, Anthithrombin, protein c, protein s, total and activity, Lupus anticoag, antithrombin 3, B2 glycoprotein, factor 5 leiden pending - Jak2 genotype pending - Carboxy Hb  -PT/OT consult - Aspirin 325mg  daily - Neuro consult- appreciate recs  Tobacco use:  - Nicotine patch -Counselled on cessation  DVT prophylaxis -lovenox  Dispo: Disposition is deferred at this time, awaiting improvement of current medical problems. Anticipated discharge in approximately 2-3 day(s).   This is a Psychologist, occupationalMedical Student Note.  The care of the patient was discussed with Dr. Cyndie ChimeGranfortuna and the assessment and plan formulated with their assistance.  Please see their  attached note for official documentation of the daily encounter.   LOS: 1 day   Juanetta Gosling, Med Student 02/07/2015, 8:09 AM

## 2015-02-07 NOTE — Progress Notes (Signed)
UR complete.  Brielle Moro RN, MSN 

## 2015-02-07 NOTE — Progress Notes (Signed)
  Echocardiogram 2D Echocardiogram with Definity has been performed.  Bobby BeamsGREGORY, Bobby Thompson 02/07/2015, 2:54 PM

## 2015-02-07 NOTE — Evaluation (Addendum)
Physical Therapy Evaluation Patient Details Name: Bobby Thompson MRN: [id number] DOB: September 08, 1976 Today's Date: 02/07/2015   History of Present Illness  adm with Lt sided numbness; MRI + acute on chronic brainstem ischemia; + Rt pons infarct; 8 mm aneurysm basilar artery  PMHx- tobacco user  Clinical Impression  Pt admitted with above diagnosis. Pt reporting mild vertigo/spinning that is present constantly (slightly worse when mobilizing).  Pt currently with functional limitations due to the deficits listed below (see PT Problem List). 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  Pt will benefit from skilled PT to increase their independence and safety with mobility to allow discharge to the venue listed below.       Follow Up Recommendations Outpatient PT;Supervision - Intermittent    Equipment Recommendations  None recommended by PT    Recommendations for Other Services OT consult     Precautions / Restrictions Precautions Precautions: Fall      Mobility  Bed Mobility Overal bed mobility: Needs Assistance Bed Mobility: Rolling;Sidelying to Sit Rolling: Modified independent (Device/Increase time) Sidelying to sit: Min guard       General bed mobility comments: HOB 0, + rail; nearly fell back down to sidelying as he struggle to push up/sit up from Lt sidelying, near need for assist  Transfers Overall transfer level: Needs assistance Equipment used: None Transfers: Sit to/from Stand Sit to Stand: Supervision         General transfer comment: no unsteadiness: + spinning  Ambulation/Gait Ambulation/Gait assistance: Min guard;Supervision Ambulation Distance (Feet): 300 Feet Assistive device: None Gait Pattern/deviations: Step-through pattern;Drifts right/left Gait velocity: able to vary up/down Gait velocity interpretation: Below normal speed for age/gender General  Gait Details: slightly guarded gait; pt reports environment is spinning continuously; able to maintain straight path when forward looking; with head turns he veers off the path  Stairs Stairs: Yes Stairs assistance: Min guard Stair Management: No rails;Forwards;Alternating pattern Number of Stairs: 5 General stair comments: no difficulty although he reports he can feel Lt knee weakness   Wheelchair Mobility    Modified Rankin (Stroke Patients Only) Modified Rankin (Stroke Patients Only) Pre-Morbid Rankin Score: No symptoms Modified Rankin: Moderately severe disability     Balance Overall balance assessment: Needs assistance                               Standardized Balance Assessment Standardized Balance Assessment : Berg Balance Test;Dynamic Gait Index Berg Balance Test Sit to Stand: Able to stand without using hands and stabilize independently Standing Unsupported: Able to stand safely 2 minutes Sitting with Back Unsupported but Feet Supported on Floor or Stool: Able to sit safely and securely 2 minutes Stand to Sit: Sits safely with minimal use of hands Transfers: Able to transfer safely, minor use of hands Standing Unsupported with Eyes Closed: Able to stand 10 seconds safely Standing Ubsupported with Feet Together: Able to place feet together independently and stand 1 minute safely From Standing, Reach Forward with Outstretched Arm: Can reach confidently >25 cm (10") From Standing Position, Pick up Object from Floor: Able to pick up shoe safely and easily From Standing Position, Turn to Look Behind Over each Shoulder: Looks behind from both sides and weight shifts well Turn 360 Degrees: Able to turn 360 degrees safely one side only in 4 seconds or less Standing Unsupported, Alternately Place Feet on Step/Stool: Able to  stand independently and complete 8 steps >20 seconds Standing Unsupported, One Foot in Front: Able to plae foot ahead of the other independently  and hold 30 seconds Standing on One Leg: Tries to lift leg/unable to hold 3 seconds but remains standing independently Total Score: 50 Dynamic Gait Index Level Surface: Normal Change in Gait Speed: Mild Impairment Gait with Horizontal Head Turns: Mild Impairment Gait with Vertical Head Turns: Mild Impairment Gait and Pivot Turn: Normal Step Over Obstacle: Mild Impairment Step Around Obstacles: Mild Impairment Steps: Normal Total Score: 19       Pertinent Vitals/Pain Pain Assessment: No/denies pain    Home Living Family/patient expects to be discharged to:: Private residence Living Arrangements: Spouse/significant other;Children (13, 3, 8 months) Available Help at Discharge: Family;Available 24 hours/day Type of Home: Mobile home Home Access: Stairs to enter Entrance Stairs-Rails: Left Entrance Stairs-Number of Steps: 4 Home Layout: One level Home Equipment: None      Prior Function Level of Independence: Independent         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[employer]     Hand Dominance   Dominant Hand: Right    Extremity/Trunk Assessment   Upper Extremity Assessment: Defer to OT evaluation           Lower Extremity Assessment: LLE deficits/detail   LLE Deficits / Details: knee extension 4/5; dorsiflexion 4/5  Cervical / Trunk Assessment: Other exceptions  Communication   Communication: No difficulties  Cognition Arousal/Alertness: Awake/alert Behavior During Therapy: WFL for tasks assessed/performed Overall Cognitive Status: Within Functional Limits for tasks assessed                      General Comments      Exercises        Assessment/Plan    PT Assessment Patient needs continued PT services  PT Diagnosis Hemiplegia non-dominant side;Difficulty walking   PT Problem List Decreased strength;Decreased balance;Decreased coordination;Impaired sensation  PT Treatment Interventions Gait training;Stair training;Functional mobility  training;Therapeutic activities;Balance training;Neuromuscular re-education;Patient/family education   PT Goals (Current goals can be found in the Care Plan section) Acute Rehab PT Goals Patient Stated Goal: ultimately return to work (when safe) PT Goal Formulation: With patient Time For Goal Achievement: 02/10/15 Potential to Achieve Goals: Good    Frequency Min 4X/week   Barriers to discharge        Co-evaluation               End of Session Equipment Utilized During Treatment: Gait belt Activity Tolerance: Patient tolerated treatment well Patient left: in chair;with call bell/phone within reach;with family/visitor present Nurse Communication: Mobility status         Time: [id number] PT Time Calculation (min) (ACUTE ONLY): 28 min   Charges:   PT Evaluation $Initial PT Evaluation Tier I: 1 Procedure PT Treatments $Gait Training: 8-22 mins   PT G Codes:        Finian Helvey 02/07/2015, 11:05 AM Pager [phone]

## 2015-02-07 NOTE — Progress Notes (Signed)
STROKE TEAM PROGRESS NOTE   HISTORY Bobby Thompson is an 39 y.o. male with no significant PMHx other than tobacco abuse. Patient noted yesterday 02/05/2015 (time unknown) he had new onset of decreased sensation in his left leg, arm and face. He did not seek medical attention until today 02/05/2105 when he noted the symptoms did not dissipate. He states he has had similar symptoms in the past but never sought medical attention. He does not take ASA daily. Patient was not administered TPA secondary to delay in arrival. He was admitted for further evaluation and treatment.   SUBJECTIVE (INTERVAL HISTORY) His wife is at the bedside.  Overall he feels his condition is stable.    OBJECTIVE Temp:  [97.6 F (36.4 C)-99.3 F (37.4 C)] 99.3 F (37.4 C) (03/22 0941) Pulse Rate:  [73-93] 87 (03/22 0941) Cardiac Rhythm:  [-] Normal sinus rhythm (03/21 2000) Resp:  [12-18] 16 (03/22 0941) BP: (106-141)/(57-83) 132/77 mmHg (03/22 0941) SpO2:  [93 %-98 %] 97 % (03/22 0941)   Recent Labs Lab 02/06/15 1412  GLUCAP 115*    Recent Labs Lab 02/06/15 1224  NA 140  K 3.9  CL 102  CO2 29  GLUCOSE 125*  BUN 7  CREATININE 0.75  CALCIUM 9.3    Recent Labs Lab 02/06/15 1224  AST 191*  ALT 216*  ALKPHOS 95  BILITOT 1.0  PROT 7.6  ALBUMIN 4.2    Recent Labs Lab 02/06/15 1224  WBC 10.7*  NEUTROABS 7.1  HGB 17.3*  HCT 48.4  MCV 90.6  PLT 235   No results for input(s): CKTOTAL, CKMB, CKMBINDEX, TROPONINI in the last 168 hours.  Recent Labs  02/06/15 1224  LABPROT 14.1  INR 1.07   No results for input(s): COLORURINE, LABSPEC, PHURINE, GLUCOSEU, HGBUR, BILIRUBINUR, KETONESUR, PROTEINUR, UROBILINOGEN, NITRITE, LEUKOCYTESUR in the last 72 hours.  Invalid input(s): APPERANCEUR     Component Value Date/Time   CHOL 182 02/07/2015 0700   TRIG 387* 02/07/2015 0700   HDL 25* 02/07/2015 0700   CHOLHDL 7.3 02/07/2015 0700   VLDL 77* 02/07/2015 0700   LDLCALC 80 02/07/2015 0700    No results found for: HGBA1C No results found for: LABOPIA, COCAINSCRNUR, LABBENZ, AMPHETMU, THCU, LABBARB  No results for input(s): ETH in the last 168 hours.  Ct Head (brain) Wo Contrast  02/06/2015   CLINICAL DATA:  Left-sided weakness and numbness  EXAM: CT HEAD WITHOUT CONTRAST  TECHNIQUE: Contiguous axial images were obtained from the base of the skull through the vertex without intravenous contrast.  COMPARISON:  None.  FINDINGS: Bony calvarium is intact. Mucosal retention cysts are noted within the maxillary antra bilaterally. Changes consistent with prior lacunar infarcts are noted in the midline of the pons as well as the right side of the pons. These would correspond with the patient's given clinical history of prior stroke-like symptoms. No findings to suggest acute hemorrhage, acute infarction or space-occupying mass lesion are noted.  IMPRESSION: Chronic changes as described.  No acute abnormality is noted.   Electronically Signed   By: Alcide CleverMark  Lukens M.D.   On: 02/06/2015 13:28   Mr Maxine GlennMra Head Wo Contrast  02/06/2015   CLINICAL DATA:  39 year old male who awoke with left side numbness today. TIA. Initial encounter.  EXAM: MRI HEAD WITHOUT CONTRAST  MRA HEAD WITHOUT CONTRAST  TECHNIQUE: Multiplanar, multiecho pulse sequences of the brain and surrounding structures were obtained without intravenous contrast. Angiographic images of the head were obtained using MRA technique without contrast.  COMPARISON:  CT head without contrast 1250 hours today.  FINDINGS: MRI HEAD FINDINGS  There is confluent restricted diffusion in the right paracentral pons with corresponding T2 and FLAIR hyperintensity. This is just lateral and cephalad to a chronic more central pontine lacunar infarct. No associated hemorrhage or significant mass effect.  No other restricted diffusion. Major intracranial vascular flow voids are preserved. However, there is enlargement of the basilar artery tip measuring up to 6-7 mm. See  MRA findings below.  No other posterior fossa chronic ischemic disease. Supratentorial gray and white matter signal is within normal limits. No midline shift, mass effect, evidence of mass lesion, ventriculomegaly, extra-axial collection or acute intracranial hemorrhage. Cervicomedullary junction and pituitary are within normal limits. Negative visualized cervical spine.  Visible internal auditory structures appear normal. Mastoids are clear. Left greater than right maxillary sinus mucous retention cyst. Visualized orbit soft tissues are within normal limits. Negative scalp soft tissues. Bone marrow signal within normal limits.  MRA HEAD FINDINGS  Antegrade flow in the distal vertebral arteries, the left is mildly dominant. The distal right vertebral artery is diminutive beyond the patent right PICA origin. No distal vertebral artery stenosis. Patent vertebrobasilar junction. Mild basilar artery irregularity with no stenosis.  Heterogeneous flow signal within the distal basilar artery compatible with an up to 8 mm basilar tip aneurysm involving the PCA origins, more so the left. Posterior communicating arteries are diminutive or absent. Bilateral PCA branches remain within normal limits.  Antegrade flow in both ICA siphons. No siphon stenosis. Ophthalmic artery origins are within normal limits. Normal carotid termini, MCA and ACA origins. Anterior communicating artery and visualized ACA branches within normal limits. Visualized bilateral MCA branches are within normal limits.  IMPRESSION: 1. Acute on chronic brainstem ischemia; Acute right paracentral pons lacunar infarct. No associated mass effect or hemorrhage. 2. Basilar tip aneurysm, 8 mm. Mild basilar artery irregularity elsewhere, no posterior circulation stenosis or major circle of Willis branch occlusion identified.   Electronically Signed   By: Odessa Fleming M.D.   On: 02/06/2015 18:21   Mr Brain Wo Contrast  02/06/2015   CLINICAL DATA:  39 year old male who  awoke with left side numbness today. TIA. Initial encounter.  EXAM: MRI HEAD WITHOUT CONTRAST  MRA HEAD WITHOUT CONTRAST  TECHNIQUE: Multiplanar, multiecho pulse sequences of the brain and surrounding structures were obtained without intravenous contrast. Angiographic images of the head were obtained using MRA technique without contrast.  COMPARISON:  CT head without contrast 1250 hours today.  FINDINGS: MRI HEAD FINDINGS  There is confluent restricted diffusion in the right paracentral pons with corresponding T2 and FLAIR hyperintensity. This is just lateral and cephalad to a chronic more central pontine lacunar infarct. No associated hemorrhage or significant mass effect.  No other restricted diffusion. Major intracranial vascular flow voids are preserved. However, there is enlargement of the basilar artery tip measuring up to 6-7 mm. See MRA findings below.  No other posterior fossa chronic ischemic disease. Supratentorial gray and white matter signal is within normal limits. No midline shift, mass effect, evidence of mass lesion, ventriculomegaly, extra-axial collection or acute intracranial hemorrhage. Cervicomedullary junction and pituitary are within normal limits. Negative visualized cervical spine.  Visible internal auditory structures appear normal. Mastoids are clear. Left greater than right maxillary sinus mucous retention cyst. Visualized orbit soft tissues are within normal limits. Negative scalp soft tissues. Bone marrow signal within normal limits.  MRA HEAD FINDINGS  Antegrade flow in the distal vertebral arteries, the left is mildly dominant.  The distal right vertebral artery is diminutive beyond the patent right PICA origin. No distal vertebral artery stenosis. Patent vertebrobasilar junction. Mild basilar artery irregularity with no stenosis.  Heterogeneous flow signal within the distal basilar artery compatible with an up to 8 mm basilar tip aneurysm involving the PCA origins, more so the left.  Posterior communicating arteries are diminutive or absent. Bilateral PCA branches remain within normal limits.  Antegrade flow in both ICA siphons. No siphon stenosis. Ophthalmic artery origins are within normal limits. Normal carotid termini, MCA and ACA origins. Anterior communicating artery and visualized ACA branches within normal limits. Visualized bilateral MCA branches are within normal limits.  IMPRESSION: 1. Acute on chronic brainstem ischemia; Acute right paracentral pons lacunar infarct. No associated mass effect or hemorrhage. 2. Basilar tip aneurysm, 8 mm. Mild basilar artery irregularity elsewhere, no posterior circulation stenosis or major circle of Willis branch occlusion identified.   Electronically Signed   By: Odessa Fleming M.D.   On: 02/06/2015 18:21     PHYSICAL EXAM  obese young Hispanic male currently not in distress. . Afebrile. Head is nontraumatic. Neck is supple without bruit.    Cardiac exam no murmur or gallop. Lungs are clear to auscultation. Distal pulses are well felt. Neurological Exam : Awake alert oriented 3 with normal speech and language function. Fundi were not visualized. Vision acuity seems adequate. Extraocular movements are full range but there is saccadic dysmetria on horizontal gaze ( left greater than right). No nystagmus. Face is symmetric without weakness. Tongue is midline. Motor system exam revealed no upper or lower eczema to drift but mild weakness of left grip and intrinsic hand muscles. Orbits right over left upper extremity. Minimum weakness of left hip flexors and ankle dorsiflexors. Subjective decrease left hemibody sensation. Mild impaired left finger-to-nose coordination. Gait was not tested. Plantars are downgoing.  ASSESSMENT/PLAN [name] is a 39 y.o. male with no real medical history other than smoking who presents with left sided weakness and numbness. He did not receive IV t-PA due to delay in arrival.   Stroke:  Non-dominant right  paracentral pontine lacunar infarct secondary to small vessel disease source  Resultant  Left hand clusiness  MRI  Acute R paracentral pontine lacunar infarct  MRA  8mm basilar tip aneurysm   Carotid Doppler  pending   2D Echo  pending   HgbA1c pending  Hypercoagulable labs pending. Antithrombin III normal.  Lovenox 40 mg sq daily for VTE prophylaxis  Diet regular thin liquids  no antithrombotic prior to admission, now on aspirin 325 mg orally every day  Patient counseled to be compliant with his antithrombotic medications  Ongoing aggressive stroke risk factor management  Therapy recommendations:  OP PT. Will likely need OT as well.  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 Needs to improve prior to returning to work.  Basilar tip aneurysm  Incidental finding  Monitor over time  Consider intervention in the future  For now, Stop smoking, keep BP under control, avoid etoh  Risk of rupture 1% per year  Follow up with neurology Pearlean Brownie) in 2 months  Hyperlipidemia  Home meds:  No statin  LDL 80, goal < 70  New lipitor 20 added  Continue statin at discharge  Elevated LFTs AST 191*  ALT 216*    Other Stroke Risk Factors  Cigarette smoker, advised to stop smoking  ETOH use  Obesity, Body mass index is 36.8 kg/(m^2).   Hospital  day # 1  [facility] See Amion for Pager information 02/07/2015 12:42 PM  I have personally examined this patient, reviewed notes, independently viewed imaging studies, participated in medical decision making and plan of care. I have made any additions or clarifications directly to the above note. Agree with note above. He has a pontine infarct most likely due to small vessel disease and an incidental small basilar tip aneurysm. He remains at risk for recurrent strokes, TIAs, neurological worsening and needs ongoing stroke evaluation and aggressive risk  factor modification. We'll discuss treatment options for asymptomatic aneurysms which I do not think is related to his current stroke and will address this upon follow-up  Delia Heady, MD Medical Director [facility] Pager: [phone] 02/07/2015 2:53 PM    To contact Stroke Continuity provider, please refer to [email or web address]. After hours, contact General Neurology

## 2015-02-07 NOTE — Progress Notes (Signed)
Patient arrive to 4N12 at 1900 alert and oriented no complaints.

## 2015-02-07 NOTE — Evaluation (Signed)
Occupational Therapy Evaluation Patient Details Name: Bobby Thompson MRN: [id number] DOB: 11/15/1976 Today's Date: 02/07/2015    History of Present Illness adm with Lt sided numbness; MRI + acute on chronic brainstem ischemia; + Rt pons infarct; 8 mm aneurysm basilar artery  PMHx- tobacco user   Clinical Impression   Pt admitted with the above diagnoses and presents with below problem list. Pt will benefit from continued acute OT to address the below listed deficits and maximize independence with BADLs prior to d/c home with family. Pt presents with Lt side weakness and decreased sensation on Lt side. Pt noted to have some difficulty maintaining gaze to the left and superior fields. Decreased reaction time and dynamic balance impacting level with ADLs. PTA pt was independent with ADLs, worked in maintenance and drove. Pt currently at min guard level for LB ADLs and functional mobility due to safety with balance. OT to continue to follow acutely.     Follow Up Recommendations  Supervision/Assistance - 24 hour;Other (comment);Outpatient OT ([facility])    Equipment Recommendations  3 in 1 bedside comode    Recommendations for Other Services       Precautions / Restrictions Precautions Precautions: Fall Restrictions Weight Bearing Restrictions: No      Mobility Bed Mobility Overal bed mobility: Needs Assistance Bed Mobility: Rolling;Sidelying to Sit Rolling: Modified independent (Device/Increase time) Sidelying to sit: Min guard       General bed mobility comments: in recliner  Transfers Overall transfer level: Needs assistance Equipment used: None Transfers: Sit to/from Stand Sit to Stand: Supervision         General transfer comment: pt reported no dizziness/spinning. However was noted to prefer to maintain straightforward or downward gaze throughout session.    Balance Overall balance assessment: Needs assistance         Standing balance support: No  upper extremity supported;During functional activity   Standing balance comment: no LOB observed during session. Pt noted to move slower especially during dynamic standing activities.                  Standardized Balance Assessment Standardized Balance Assessment :     ADL Overall ADL's : Needs assistance/impaired Eating/Feeding: Set up;Sitting   Grooming: Set up;Oral care;Wash/dry hands;Standing   Upper Body Bathing: Set up;Sitting   Lower Body Bathing: Min guard;Sit to/from stand   Upper Body Dressing : Set up;Sitting   Lower Body Dressing: Min guard;Sit to/from stand;Cueing for compensatory techniques   Toilet Transfer: Min guard;Ambulation;Comfort height toilet;Grab bars   Toileting- Clothing Manipulation and Hygiene: Min guard;Sit to/from stand   Tub/ Shower Transfer: Min guard;Ambulation;Shower seat;Grab bars   Functional mobility during ADLs: Min guard General ADL Comments: Pt completed oral care standing at sink min guard. Encouraged pt to inciorporate LUE into tasks to help facilitate improved strength and corrdination. Pt noted to have decreased reaction time to toss a small ball back and forth between hands, completed at reduced speed. Discussed safety with environment.      Vision Vision Assessment?: Yes Eye Alignment: Within Functional Limits Ocular Range of Motion: Within Functional Limits;Other (comment) (nystagumus at end range in Lt visual field) Alignment/Gaze Preference: Within Defined Limits;Chin down Tracking/Visual Pursuits: Decreased smoothness of eye movement to LEFT superior field;Decreased smoothness of eye movement to LEFT inferior field;Decreased smoothness of eye movement to RIGHT superior field Saccades: Within functional limits Visual Fields: No apparent deficits Diplopia Assessment: Other (comment) (none reported) Additional Comments: Pt able to scan to find items on  menu and able to read distance of time on wall clock. Pt reports some  difficulty holding gaze to the left and superiorly.   Perception     Praxis      Pertinent Vitals/Pain Pain Assessment: No/denies pain     Hand Dominance Right   Extremity/Trunk Assessment Upper Extremity Assessment Upper Extremity Assessment: LUE deficits/detail LUE Deficits / Details: grossly 4/5 LUE, LUE lags behind somewhat during bilateral tasks, with cueing can bring to WNL AROM. LUE Sensation: decreased light touch LUE Coordination: decreased fine motor (decreased use of LUE to asssit with opening packaging )   Lower Extremity Assessment Lower Extremity Assessment: Defer to PT evaluation LLE Deficits / Details:    o   Communication Communication Communication: No difficulties   Cognition Arousal/Alertness: Awake/alert Behavior During Therapy: WFL for tasks assessed/performed Overall Cognitive Status: Within Functional Limits for tasks assessed                     General Comments       Exercises       Shoulder Instructions      Home Living Family/patient expects to be discharged to:: Private residence Living Arrangements: Spouse/significant other;Children Available Help at Discharge: Family;Available 24 hours/day Type of Home: Mobile home Home Access: Stairs to enter Entrance Stairs-Number of Steps: 4 Entrance Stairs-Rails: Left Home Layout: One level               Home Equipment: None          Prior Functioning/Environment Level of Independence: Independent        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[employer]; drives    OT Diagnosis: Other (comment) (hemiparesis non-dominant side, decreased Lt side sensation )   OT Problem List: Decreased strength;Decreased range of motion;Decreased activity tolerance;Impaired balance (sitting and/or standing);Impaired vision/perception;Decreased coordination;Decreased knowledge of use of DME or AE;Decreased knowledge of precautions;Impaired tone;Impaired UE functional use   OT Treatment/Interventions:  Self-care/ADL training;Therapeutic exercise;Neuromuscular education;DME and/or AE instruction;Therapeutic activities;Visual/perceptual remediation/compensation;Patient/family education;Balance training    OT Goals(Current goals can be found in the care plan section) Acute Rehab OT Goals Patient Stated Goal: ultimately return to work (when safe) OT Goal Formulation: With patient Time For Goal Achievement: 02/21/15 Potential to Achieve Goals: Good ADL Goals Pt Will Perform Lower Body Bathing: with modified independence;with adaptive equipment;sit to/from stand Pt Will Perform Lower Body Dressing: with modified independence;with adaptive equipment;sit to/from stand Pt/caregiver will Perform Home Exercise Program: Increased strength;Left upper extremity;With written HEP provided  OT Frequency: Min 2X/week   Barriers to D/C:            Co-evaluation              End of Session Equipment Utilized During Treatment: Gait belt  Activity Tolerance: Patient tolerated treatment well Patient left: in chair;with call bell/phone within reach;with chair alarm set;with family/visitor present   Time: 6962-9528 OT Time Calculation (min): 28 min Charges:  OT General Charges $OT Visit: 1 Procedure OT Evaluation $Initial OT Evaluation Tier I: 1 Procedure OT Treatments $Self Care/Home Management : 8-22 mins G-Codes:    Pilar Grammes Feb 08, 2015, 12:59 PM

## 2015-02-07 NOTE — Discharge Summary (Signed)
Name: Bobby Thompson MRN: [id number] DOB: 01/12/1976 39 y.o. PCP: No primary care provider on file.  Date of Admission: 02/06/2015 12:51 PM Date of Discharge: 02/10/2015 Attending Physician: No att. providers found  Discharge Diagnosis: Principal Problem:   CVA (cerebral vascular accident) Active Problems:   TIA (transient ischemic attack)   Stroke   Diabetes type 2, uncontrolled   Dyslipidemia  Discharge Medications:   Medication List    TAKE these medications        aspirin 81 MG EC tablet  Take 1 tablet (81 mg total) by mouth daily.     metFORMIN 500 MG tablet  Commonly known as:  GLUCOPHAGE  Take 1 tablet (500 mg total) by mouth daily.     nicotine 21 mg/24hr patch  Commonly known as:  NICODERM CQ - dosed in mg/24 hours  Place 1 patch (21 mg total) onto the skin daily.        Disposition and follow-up:   Mr.Bobby Thompson was [facility] in Good condition.  At the hospital follow up visit please address:  1.  Recovery from CVA- improving or resolved Paraesthesia and mild left sided weakness. -Elevation in AST/ALT with negative hepatitis panel, possible fatty liver, please follow up liver function as patient was started on Atorvastatin, might require autoimmune work up and rule out hemochromatosis.  -Metformin dosage: started on 500mg  qd, if tolerates well can consider increasing dosage  2.  Labs / imaging needed at time of follow-up: none  3.  Pending labs/ test needing follow-up: none  Follow-up Appointments: Follow-up Information    Follow up with Evelena PeatWilson, Alex, DO On 02/21/2015.   Specialty:  Internal Medicine   Why:  8:45 am   Contact information:   [address] [place] [address] [phone]       Follow up with SETHI,PRAMOD, MD In 2 months.   Specialties:  Neurology, Radiology   Why:  Stroke Clinic, Office will call you with appointment date & time   Contact information:   [address] [place] [address] [phone]     Dr. Andrey CampanileWilson on 4/5 at 8:45 am.   Discharge Instructions: Discharge Instructions    Ambulatory referral to Neurology    Complete by:  As directed   Please schedule post stroke follow up in 2 months.     Diet - low sodium heart healthy    Complete by:  As directed      Discharge instructions    Complete by:  As directed   We will be starting you on some new medications, to reduce your risk of strokes. 1. Take one dose of aspirin everyday. 2. We are also starting you on a new medication for your diabetes- Called  Metformin take one 500mg  tablet once day. 3. We also starting you on a medication for your cholesterol called Atorvastatin, take one 20mg  tablet once a day, at 6pm in the evening.  Please follow up with us in clinic, as your diabetes needs to be followed, your medications adjusted. Also it is very important you stop smoking cigarettes.     Increase activity slowly    Complete by:  As directed            Consultations: Treatment Team:  Md Stroke, MD  Procedures Performed:  Ct Head (brain) Wo Contrast  02/06/2015   CLINICAL DATA:  Left-sided weakness and numbness  EXAM: CT HEAD WITHOUT CONTRAST  TECHNIQUE: Contiguous axial images  were obtained from the base of the skull through the vertex without intravenous contrast.  COMPARISON:  None.  FINDINGS: Bony calvarium is intact. Mucosal retention cysts are noted within the maxillary antra bilaterally. Changes consistent with prior lacunar infarcts are noted in the midline of the pons as well as the right side of the pons. These would correspond with the patient's given clinical history of prior stroke-like symptoms. No findings to suggest acute hemorrhage, acute infarction or space-occupying mass lesion are noted.  IMPRESSION: Chronic changes as described.  No acute abnormality is noted.   Electronically Signed   By: Alcide Clever M.D.   On: 02/06/2015 13:28   Mr [name] Head Wo  Contrast  02/06/2015   CLINICAL DATA:  39 year old male who awoke with left side numbness today. TIA. Initial encounter.  EXAM: MRI HEAD WITHOUT CONTRAST  MRA HEAD WITHOUT CONTRAST  TECHNIQUE: Multiplanar, multiecho pulse sequences of the brain and surrounding structures were obtained without intravenous contrast. Angiographic images of the head were obtained using MRA technique without contrast.  COMPARISON:  CT head without contrast 1250 hours today.  FINDINGS: MRI HEAD FINDINGS  There is confluent restricted diffusion in the right paracentral pons with corresponding T2 and FLAIR hyperintensity. This is just lateral and cephalad to a chronic more central pontine lacunar infarct. No associated hemorrhage or significant mass effect.  No other restricted diffusion. Major intracranial vascular flow voids are preserved. However, there is enlargement of the basilar artery tip measuring up to 6-7 mm. See MRA findings below.  No other posterior fossa chronic ischemic disease. Supratentorial gray and white matter signal is within normal limits. No midline shift, mass effect, evidence of mass lesion, ventriculomegaly, extra-axial collection or acute intracranial hemorrhage. Cervicomedullary junction and pituitary are within normal limits. Negative visualized cervical spine.  Visible internal auditory structures appear normal. Mastoids are clear. Left greater than right maxillary sinus mucous retention cyst. Visualized orbit soft tissues are within normal limits. Negative scalp soft tissues. Bone marrow signal within normal limits.  MRA HEAD FINDINGS  Antegrade flow in the distal vertebral arteries, the left is mildly dominant. The distal right vertebral artery is diminutive beyond the patent right PICA origin. No distal vertebral artery stenosis. Patent vertebrobasilar junction. Mild basilar artery irregularity with no stenosis.  Heterogeneous flow signal within the distal basilar artery compatible with an up to 8 mm  basilar tip aneurysm involving the PCA origins, more so the left. Posterior communicating arteries are diminutive or absent. Bilateral PCA branches remain within normal limits.  Antegrade flow in both ICA siphons. No siphon stenosis. Ophthalmic artery origins are within normal limits. Normal carotid termini, MCA and ACA origins. Anterior communicating artery and visualized ACA branches within normal limits. Visualized bilateral MCA branches are within normal limits.  IMPRESSION: 1. Acute on chronic brainstem ischemia; Acute right paracentral pons lacunar infarct. No associated mass effect or hemorrhage. 2. Basilar tip aneurysm, 8 mm. Mild basilar artery irregularity elsewhere, no posterior circulation stenosis or major circle of Willis branch occlusion identified.   Electronically Signed   By: Odessa Fleming M.D.   On: 02/06/2015 18:21   Mr Brain Wo Contrast  02/06/2015   CLINICAL DATA:  39 year old male who awoke with left side numbness today. TIA. Initial encounter.  EXAM: MRI HEAD WITHOUT CONTRAST  MRA HEAD WITHOUT CONTRAST  TECHNIQUE: Multiplanar, multiecho pulse sequences of the brain and surrounding structures were obtained without intravenous contrast. Angiographic images of the head were obtained using MRA technique without contrast.  COMPARISON:  CT head without contrast 1250 hours today.  FINDINGS: MRI HEAD FINDINGS  There is confluent restricted diffusion in the right paracentral pons with corresponding T2 and FLAIR hyperintensity. This is just lateral and cephalad to a chronic more central pontine lacunar infarct. No associated hemorrhage or significant mass effect.  No other restricted diffusion. Major intracranial vascular flow voids are preserved. However, there is enlargement of the basilar artery tip measuring up to 6-7 mm. See MRA findings below.  No other posterior fossa chronic ischemic disease. Supratentorial gray and white matter signal is within normal limits. No midline shift, mass effect,  evidence of mass lesion, ventriculomegaly, extra-axial collection or acute intracranial hemorrhage. Cervicomedullary junction and pituitary are within normal limits. Negative visualized cervical spine.  Visible internal auditory structures appear normal. Mastoids are clear. Left greater than right maxillary sinus mucous retention cyst. Visualized orbit soft tissues are within normal limits. Negative scalp soft tissues. Bone marrow signal within normal limits.  MRA HEAD FINDINGS  Antegrade flow in the distal vertebral arteries, the left is mildly dominant. The distal right vertebral artery is diminutive beyond the patent right PICA origin. No distal vertebral artery stenosis. Patent vertebrobasilar junction. Mild basilar artery irregularity with no stenosis.  Heterogeneous flow signal within the distal basilar artery compatible with an up to 8 mm basilar tip aneurysm involving the PCA origins, more so the left. Posterior communicating arteries are diminutive or absent. Bilateral PCA branches remain within normal limits.  Antegrade flow in both ICA siphons. No siphon stenosis. Ophthalmic artery origins are within normal limits. Normal carotid termini, MCA and ACA origins. Anterior communicating artery and visualized ACA branches within normal limits. Visualized bilateral MCA branches are within normal limits.  IMPRESSION: 1. Acute on chronic brainstem ischemia; Acute right paracentral pons lacunar infarct. No associated mass effect or hemorrhage. 2. Basilar tip aneurysm, 8 mm. Mild basilar artery irregularity elsewhere, no posterior circulation stenosis or major circle of Willis branch occlusion identified.   Electronically Signed   By: Odessa Fleming M.D.   On: 02/06/2015 18:21    2D Echo:Left ventricle: The cavity size was normal. Wall thickness was increased in a pattern of mild LVH. Systolic function was normal. The estimated ejection fraction was in the range of 55% to 60%. Wall motion was normal; there were  no regional wall motion abnormalities. Left ventricular diastolic function parameters were normal.   Cardiac Cath: none  Admission HPI: 24 Y O M with no Past medical history, presented to the Ed today with complaints of left sided numbnes and weakness that started yesterday afternoon, patient says this started with a headache, then progressed to lefts sided facial numbness, with left sided weakness of his upper then his lower extremity. Significant other present at bedside says pt also had slurred speech and left sided facial droop, both of which have improved but still persistent. Pt denies having difficulty swallowing, as he date last night after symptoms started without any difficulty or choking. He has not had a problems with his gait or walking.  Pt endorsing having similar symptoms similar to the in the past- 2 years ago. He says symptoms where much worse, and involved both sides, but resolved with a few weeks. He does not have a Primary care provider, never seen a doctor, never been told he might have HTN or DM, no hx of blood clots. He denies family hx of premature heart attacks or strokes, but says his father has DM.  Pt also endorses having chest pain- 2  weeks ago, lasted for about 5 hours, constant pressure, without associated SOB, diaphoresis. Also says that he was working at work- works in Production designer, theatre/television/film at [employer]. Pt denies fever, chills, Cough, or SOB. He does not take an aspirin or any other medications. Pt smokes Cigarettes- 3 three- quarter to a pack per day everyday, for He denies drug use, and also drinks alcohol 6-8 beers about 3 times a week.  Hospital Course by problem list: Principal Problem:   CVA (cerebral vascular accident) Active Problems:   TIA (transient ischemic attack)   Stroke   Diabetes type 2, uncontrolled   Dyslipidemia   CVA:  Patient presented with a 1 day history of L sided weakness and numbness. He was outside of the time window for TPA at time  of presentation to the ED. His CT showed evidence of prior lacunar infarct- mildline of pons, and right side, without any acute abnormality. MRI/MRA with evidence of acute pontine infart and basilar artery dilatation. Given patient age, and prior history of CVA, patient received an extensive workup. Echo showed normal systolic function and EF 55-60%. Carotid dopplers- 1-39% ICA stenosis. Hypercoagulable panel- homocysteine, Antiphospholipid, protein c, protein s, total and activity, Lupus anticoag, B2 glycoprotein, prothrombin gene mutation, antithrombin 3, were all ordered and came back unremarkable.  On admission patient had Hb of 17.3, so we considered Polycythemia Vera/myeloproliferative disorders and checked Jak2 was negative for which were also pending at discharge. Elevated Hb could also be explained by smoking, so we checked carboxy Hb which was pending at d/c. Fasting lipid panel: Triglycerides 387, LDL 80; tot chol 182. Patient was started on atorvastatin 20mg  and aspirin 325mg  which he will continue at discharge. Patient's Hba1c was 8.2, patient was discharged with a prescription for metformin 500mg  qd which can be further increased at f/u if patient tolerating well. At discharge weakness and numbness was improved, with minimal residual paraesthesia in his left hand, with +4/5 strenght. Pt will also need to apply for the orange card.  Dyslipidemia- Fasting lipid profile done- 02/07/2015 TG- 387, LDL- 80, total- 182, HDL- 25. Pt was started on  Atorvastatin at 20mg  daily, consider rechecking liver enzymes on follow up.   Elevated liver enzymes- AST- 191, ALT- 216, ALP- 95, work up included Acute hep panel- which was negative. Considerations were for Possible non alcoholic fatty liver disease, considering dyslipidemia, other considerations- autoimmune hepatitis, and hemochromatosis, recommendations- Plasma iron, ferritin, and total iron binding capacity, Serum gammaglobulin level, antinuclear antibody,  antismooth muscle antibody, and anti-liver/kidney microsomal antibody-1. Pt might also benefit from heptitis A and B vaccination.   Diabetes- 02/06/2015 HgbA1c- 8.2. Pt was started on metformin 500mg  daily. Can uptitrate on follow up.  Tobacco use: Was given a Nicotine patch while on admission. Pt was extensively counselled on why he should quit smoking. He voiced understanding, he would rather use Nicotine patch, unsure if he will be able to afford this.   Liver enzyme elevation: On admission AST/ALT 191/216. Hepatitis panel negative. Possible fatty liver, autoimmune disease, hemochromatosis- considering HgB- 17.3. Recommend further workup in the outpatient setting.   Tobacco use: Patient was counseled on the importance of quitting given his hx of CVAs. He was given a nicotine patch while inpatient. He was given materials to help him quit, including the 1 800 Quit line, which he called.  Discharge Vitals:   BP 124/81 mmHg  Pulse 82  Temp(Src) 98.4 F (36.9 C) (Oral)  Resp 16  Ht 5\' 7"  (1.702 m)  Wt  235 lb (106.595 kg)  BMI 36.80 kg/m2  SpO2 95%  Discharge Labs:  No results found for this or any previous visit (from the past 24 hour(s)).  Signed: Onnie Boer, MD 02/10/2015, 11:32 AM   Services Ordered on Discharge: None Equipment Ordered on Discharge: None

## 2015-02-07 NOTE — Progress Notes (Signed)
Subjective: Still with paraesthesias in his arms and legs. No events overnight. Tele showed one time artifacts likely due to moving around, other wise regular with normal sinus rhythm.     Objective: Vital signs in last 24 hours: Filed Vitals:   02/07/15 0330 02/07/15 0530 02/07/15 0730 02/07/15 0941  BP: 111/60 117/70 112/60 132/77  Pulse: 78 73 80 87  Temp: 98.1 F (36.7 C) 97.9 F (36.6 C) 98.4 F (36.9 C) 99.3 F (37.4 C)  TempSrc: Oral Oral Oral Oral  Resp: 18 18 16 16   Height:      Weight:      SpO2: 96% 98% 96% 97%   Weight change:   Intake/Output Summary (Last 24 hours) at 02/07/15 1147 Last data filed at 02/06/15 2030  Gross per 24 hour  Intake    240 ml  Output      0 ml  Net    240 ml   General appearance:lying in bed, significant other at bedside, alert, cooperative, appears stated age and no distress Head: Normocephalic, without obvious abnormality, atraumatic Lungs: clear to auscultation bilaterally Heart: regular rate and rhythm, S1, S2 normal, no murmur, click, rub or gallop Abdomen: soft, non-tender; bowel sounds normal; no masses,  no organomegaly Extremities: extremities normal, atraumatic, no cyanosis or edema Neurologic: Alert and oriented , normal strength and tone. Normal symmetric reflexes. Normal coordination. Strenght- 5/5 globally except for +4/5 grip strenght in left hand.  Lab Results: Basic Metabolic Panel:  Recent Labs Lab 02/06/15 1224  NA 140  K 3.9  CL 102  CO2 29  GLUCOSE 125*  BUN 7  CREATININE 0.75  CALCIUM 9.3   Liver Function Tests:  Recent Labs Lab 02/06/15 1224  AST 191*  ALT 216*  ALKPHOS 95  BILITOT 1.0  PROT 7.6  ALBUMIN 4.2   CBC:  Recent Labs Lab 02/06/15 1224  WBC 10.7*  NEUTROABS 7.1  HGB 17.3*  HCT 48.4  MCV 90.6  PLT 235   CBG:  Recent Labs Lab 02/06/15 1412  GLUCAP 115*   Fasting Lipid Panel:  Recent Labs Lab 02/07/15 0700  CHOL 182  HDL 25*  LDLCALC 80  TRIG [id number]*    CHOLHDL 7.3   Coagulation:  Recent Labs Lab 02/06/15 1224  LABPROT 14.1  INR 1.07   Micro Results: No results found for this or any previous visit (from the past 240 hour(s)). Studies/Results: Ct Head (brain) Wo Contrast  02/06/2015   CLINICAL DATA:  Left-sided weakness and numbness  EXAM: CT HEAD WITHOUT CONTRAST  TECHNIQUE: Contiguous axial images were obtained from the base of the skull through the vertex without intravenous contrast.  COMPARISON:  None.  FINDINGS: Bony calvarium is intact. Mucosal retention cysts are noted within the maxillary antra bilaterally. Changes consistent with prior lacunar infarcts are noted in the midline of the pons as well as the right side of the pons. These would correspond with the patient's given clinical history of prior stroke-like symptoms. No findings to suggest acute hemorrhage, acute infarction or space-occupying mass lesion are noted.  IMPRESSION: Chronic changes as described.  No acute abnormality is noted.   Electronically Signed   By: Alcide CleverMark  Lukens M.D.   On: 02/06/2015 13:28   Mr Maxine GlennMra Head Wo Contrast  02/06/2015   CLINICAL DATA:  39 year old male who awoke with left side numbness today. TIA. Initial encounter.  EXAM: MRI HEAD WITHOUT CONTRAST  MRA HEAD WITHOUT CONTRAST  TECHNIQUE: Multiplanar, multiecho pulse sequences of the brain and  surrounding structures were obtained without intravenous contrast. Angiographic images of the head were obtained using MRA technique without contrast.  COMPARISON:  CT head without contrast 1250 hours today.  FINDINGS: MRI HEAD FINDINGS  There is confluent restricted diffusion in the right paracentral pons with corresponding T2 and FLAIR hyperintensity. This is just lateral and cephalad to a chronic more central pontine lacunar infarct. No associated hemorrhage or significant mass effect.  No other restricted diffusion. Major intracranial vascular flow voids are preserved. However, there is enlargement of the basilar  artery tip measuring up to 6-7 mm. See MRA findings below.  No other posterior fossa chronic ischemic disease. Supratentorial gray and white matter signal is within normal limits. No midline shift, mass effect, evidence of mass lesion, ventriculomegaly, extra-axial collection or acute intracranial hemorrhage. Cervicomedullary junction and pituitary are within normal limits. Negative visualized cervical spine.  Visible internal auditory structures appear normal. Mastoids are clear. Left greater than right maxillary sinus mucous retention cyst. Visualized orbit soft tissues are within normal limits. Negative scalp soft tissues. Bone marrow signal within normal limits.  MRA HEAD FINDINGS  Antegrade flow in the distal vertebral arteries, the left is mildly dominant. The distal right vertebral artery is diminutive beyond the patent right PICA origin. No distal vertebral artery stenosis. Patent vertebrobasilar junction. Mild basilar artery irregularity with no stenosis.  Heterogeneous flow signal within the distal basilar artery compatible with an up to 8 mm basilar tip aneurysm involving the PCA origins, more so the left. Posterior communicating arteries are diminutive or absent. Bilateral PCA branches remain within normal limits.  Antegrade flow in both ICA siphons. No siphon stenosis. Ophthalmic artery origins are within normal limits. Normal carotid termini, MCA and ACA origins. Anterior communicating artery and visualized ACA branches within normal limits. Visualized bilateral MCA branches are within normal limits.  IMPRESSION: 1. Acute on chronic brainstem ischemia; Acute right paracentral pons lacunar infarct. No associated mass effect or hemorrhage. 2. Basilar tip aneurysm, 8 mm. Mild basilar artery irregularity elsewhere, no posterior circulation stenosis or major circle of Willis branch occlusion identified.   Electronically Signed   By: Odessa Fleming M.D.   On: 02/06/2015 18:21   Mr Brain Wo Contrast  02/06/2015    CLINICAL DATA:  39 year old male who awoke with left side numbness today. TIA. Initial encounter.  EXAM: MRI HEAD WITHOUT CONTRAST  MRA HEAD WITHOUT CONTRAST  TECHNIQUE: Multiplanar, multiecho pulse sequences of the brain and surrounding structures were obtained without intravenous contrast. Angiographic images of the head were obtained using MRA technique without contrast.  COMPARISON:  CT head without contrast 1250 hours today.  FINDINGS: MRI HEAD FINDINGS  There is confluent restricted diffusion in the right paracentral pons with corresponding T2 and FLAIR hyperintensity. This is just lateral and cephalad to a chronic more central pontine lacunar infarct. No associated hemorrhage or significant mass effect.  No other restricted diffusion. Major intracranial vascular flow voids are preserved. However, there is enlargement of the basilar artery tip measuring up to 6-7 mm. See MRA findings below.  No other posterior fossa chronic ischemic disease. Supratentorial gray and white matter signal is within normal limits. No midline shift, mass effect, evidence of mass lesion, ventriculomegaly, extra-axial collection or acute intracranial hemorrhage. Cervicomedullary junction and pituitary are within normal limits. Negative visualized cervical spine.  Visible internal auditory structures appear normal. Mastoids are clear. Left greater than right maxillary sinus mucous retention cyst. Visualized orbit soft tissues are within normal limits. Negative scalp soft tissues. Bone  marrow signal within normal limits.  MRA HEAD FINDINGS  Antegrade flow in the distal vertebral arteries, the left is mildly dominant. The distal right vertebral artery is diminutive beyond the patent right PICA origin. No distal vertebral artery stenosis. Patent vertebrobasilar junction. Mild basilar artery irregularity with no stenosis.  Heterogeneous flow signal within the distal basilar artery compatible with an up to 8 mm basilar tip aneurysm  involving the PCA origins, more so the left. Posterior communicating arteries are diminutive or absent. Bilateral PCA branches remain within normal limits.  Antegrade flow in both ICA siphons. No siphon stenosis. Ophthalmic artery origins are within normal limits. Normal carotid termini, MCA and ACA origins. Anterior communicating artery and visualized ACA branches within normal limits. Visualized bilateral MCA branches are within normal limits.  IMPRESSION: 1. Acute on chronic brainstem ischemia; Acute right paracentral pons lacunar infarct. No associated mass effect or hemorrhage. 2. Basilar tip aneurysm, 8 mm. Mild basilar artery irregularity elsewhere, no posterior circulation stenosis or major circle of Willis branch occlusion identified.   Electronically Signed   By: Odessa Fleming M.D.   On: 02/06/2015 18:21   Medications: I have reviewed the patient's current medications. Scheduled Meds: . aspirin EC  325 mg Oral Daily  . atorvastatin  20 mg Oral q1800  . enoxaparin (LOVENOX) injection  40 mg Subcutaneous Q24H  . nicotine  14 mg Transdermal Daily   Continuous Infusions:  PRN Meds:. Assessment/Plan:  CVA- Seen on MRI- pontine stroke, prior stroke also pontine which is unusual also considering his age. With improving but persistent deficits. Stroke work up, including hypercoagulable still underway. So far lipid panel with elevated triglycerides. Risk factors- So far Obesity, dyslipidemia and Smoking.Given patient age, and prior history of CVA, will pursue extensive workup including hypercoagulable panel. -Hga1C pending -telemetry - Echo -carotid artery dopplers  - Hypercoagulable panel- homocysteine, Antiphospholipid, Anthithrombin, protein c, protein s, total and activity, Lupus anticoag, antithrombin 3, B2 glycoprotein, factor 5 leiden. So far antithrombin 3 negative. - PT/OT consult- out patient follow up - Regular diet after until passes RN bed side swallow - Aspirin  daily - Neuro  consult- appreciate recs - Also considering slight erythrocytosis and leukocytosis, will work up with JAK2 for polycythemia vera/myeloproliferative dsd, though platelet WNL, but eythrocytosis may be from smoking. Will also check Carboxyhemoglobin level.  Dyslipidemia- TG- 387, LDL- 80, total- 182, HDL- 25. - Will start Atorvastatoin at  daily.  Tobacco use:  - Nicotine patch -Counselled on cessation  DVT prophylaxis -lovenox  Dispo: Disposition is deferred at this time, awaiting improvement of current medical problems.  Anticipated discharge in approximately 1-2 day(s).   The patient does not have a current PCP (No primary care provider on file.) and does not need an [facility] hospital follow-up appointment after discharge.  The patient does not know have transportation limitations that hinder transportation to clinic appointments.  .Services Needed at time of discharge: Y = Yes, Blank = No PT:   OT:   RN:   Equipment:   Other:     LOS: 1 day   Onnie Boer, MD 02/07/2015, 11:47 AM

## 2015-02-08 DIAGNOSIS — E1165 Type 2 diabetes mellitus with hyperglycemia: Secondary | ICD-10-CM

## 2015-02-08 DIAGNOSIS — R748 Abnormal levels of other serum enzymes: Secondary | ICD-10-CM

## 2015-02-08 DIAGNOSIS — E785 Hyperlipidemia, unspecified: Secondary | ICD-10-CM

## 2015-02-08 LAB — HEMOGLOBIN A1C
HEMOGLOBIN A1C: 8.2 % — AB (ref 4.8–5.6)
MEAN PLASMA GLUCOSE: 189 mg/dL

## 2015-02-08 LAB — HEPATITIS PANEL, ACUTE
HCV Ab: NEGATIVE
HEP A IGM: NONREACTIVE
HEP B C IGM: NONREACTIVE
HEP B S AG: NEGATIVE

## 2015-02-08 LAB — HOMOCYSTEINE: Homocysteine: 9.7 umol/L (ref 0.0–15.0)

## 2015-02-08 MED ORDER — ASPIRIN 81 MG PO TBEC: 81.0000 mg | DELAYED_RELEASE_TABLET | Freq: Every day | ORAL | Status: DC

## 2015-02-08 MED ORDER — ATORVASTATIN CALCIUM 20 MG PO TABS: 20.0000 mg | ORAL_TABLET | Freq: Every day | ORAL | Status: DC

## 2015-02-08 MED ORDER — NICOTINE 21 MG/24HR TD PT24: 21.0000 mg | MEDICATED_PATCH | Freq: Every day | TRANSDERMAL | Status: DC

## 2015-02-08 MED ORDER — METFORMIN HCL 500 MG PO TABS: 500.0000 mg | ORAL_TABLET | Freq: Every day | ORAL | Status: DC

## 2015-02-08 MED ORDER — NICOTINE 21 MG/24HR TD PT24
21.0000 mg | MEDICATED_PATCH | Freq: Every day | TRANSDERMAL | Status: DC
  Administered 2015-02-08: 21 mg via TRANSDERMAL
  Filled 2015-02-08: qty 1

## 2015-02-08 NOTE — Progress Notes (Signed)
*  PRELIMINARY RESULTS* Vascular Ultrasound Carotid Duplex (Doppler) has been completed.  Preliminary findings: Bilateral:  1-39% ICA stenosis.  Vertebral artery flow is antegrade.      Farrel DemarkJill Eunice, RDMS, RVT  02/08/2015, 1:15 PM

## 2015-02-08 NOTE — Progress Notes (Signed)
Occupational Therapy Treatment Patient Details Name: Bobby Thompson MRN: [id number] DOB: 12/06/1975 Today's Date: 02/08/2015    History of present illness adm with Lt sided numbness; MRI + acute on chronic brainstem ischemia; + Rt pons infarct; 8 mm aneurysm basilar artery  PMHx- tobacco user   OT comments  Education provided in session. Recommending Outpatient OT for further rehab.  Follow Up Recommendations  Other (comment);Outpatient OT;Supervision - Intermittent ([facility] Outpatient [facility])    Equipment Recommendations  3 in 1 bedside comode    Recommendations for Other Services      Precautions / Restrictions Precautions Precautions: Fall Restrictions Weight Bearing Restrictions: No       Mobility Bed Mobility               General bed mobility comments: not assessed  Transfers Overall transfer level: Needs assistance Equipment used: None Transfers: Sit to/from Stand Sit to Stand: Supervision         General transfer comment: pt dizzy upon standing    Balance    Supervision for ambulation.                               ADL Overall ADL's : Needs assistance/impaired                     Lower Body Dressing: Modified independent;Sitting/lateral leans (donned/doffed socks (already had socks on feet or would be supervision to retrieve socks)   Toilet Transfer: Supervision/safety;Ambulation (chair)           Functional mobility during ADLs: Supervision/safety (ambulated in hallway) General ADL Comments: Encouraged pt to be using left hand for activities. Educated on safety such as items on floor/rugs, sitting for most of LB ADLs and explained he could use 3 in 1 as shower chair. Recommended pt avoid hot surfaces/sharp and dangerous objects due to decreased sensation in left hand.       Vision                     Perception     Praxis      Cognition  Awake/Alert Behavior During Therapy: WFL for tasks  assessed/performed Overall Cognitive Status: Within Functional Limits for tasks assessed                       Extremity/Trunk Assessment               Exercises Other Exercises Other Exercises: gave pt theraputty and he performed exercises with left hand. Educated on fine motor activities/exercise he could do with left hand.    Shoulder Instructions       General Comments      Pertinent Vitals/ Pain       Pain Assessment: No/denies pain  Home Living                                          Prior Functioning/Environment              Frequency Min 2X/week     Progress Toward Goals  OT Goals(current goals can now be found in the care plan section)  Progress towards OT goals: Progressing toward goals-edited goal  Acute Rehab OT Goals Patient Stated Goal: not stated OT Goal Formulation: With patient Time For Goal Achievement: 02/21/15 Potential to Achieve  Goals: Good ADL Goals Pt Will Perform Lower Body Bathing: with modified independence;with adaptive equipment;sit to/from stand Pt Will Perform Lower Body Dressing: with modified independence;with adaptive equipment;sit to/from stand Pt/caregiver will Perform Home Exercise Program: Increased strength; Increased fine motor coordination; Left upper extremity;With written HEP provided  Plan Discharge plan needs to be updated    Co-evaluation                 End of Session Equipment Utilized During Treatment: Gait belt;Other (comment) (theraputty)   Activity Tolerance Patient tolerated treatment well   Patient Left in chair;with family/visitor present   Nurse Communication          Time: 1610-9604 OT Time Calculation (min): 15 min  Charges: OT General Charges $OT Visit: 1 Procedure OT Treatments $Therapeutic Activity: 8-22 mins  Earlie Raveling OTR/L [phone] 02/08/2015, 4:39 PM

## 2015-02-08 NOTE — Progress Notes (Signed)
Subjective: No events overnight. Continues to have grip weakness but otherwise continues to feel improvement in weakness. Patient feels ready to go home.  Objective: Vital signs in last 24 hours: Filed Vitals:   02/07/15 1806 02/07/15 2026 02/08/15 0207 02/08/15 0528  BP: 143/74 131/73 107/41 92/41  Pulse: 96 87 68 73  Temp: 99.1 F (37.3 C) 97.8 F (36.6 C) 97.9 F (36.6 C) 98.1 F (36.7 C)  TempSrc: Oral Oral Oral Oral  Resp: Height:      Weight:      SpO2: 99% 95% 99% 98%   Weight change:   Intake/Output Summary (Last 24 hours) at 02/08/15 1015 Last data filed at 02/07/15 1900  Gross per 24 hour  Intake    600 ml  Output      0 ml  Net    600 ml   General appearance:lying in bed, significant other at bedside, alert, cooperative, appears stated age and no distress Head: Normocephalic, without obvious abnormality, atraumatic Lungs: clear to auscultation bilaterally Heart: regular rate and rhythm, S1, S2 normal, no murmur, click, rub or gallop Abdomen: soft, non-tender; bowel sounds normal; no masses,  no organomegaly Extremities: extremities normal, atraumatic, no cyanosis or edema Neurologic: Alert and oriented , normal strength and tone. Normal symmetric reflexes. Normal coordination. Strenght- 5/5 globally except for +4/5 grip strenght in left hand.  Lab Results: Basic Metabolic Panel:  Recent Labs Lab 02/06/15 1224  NA 140  K 3.9  CL 102  CO2 29  GLUCOSE 125*  BUN 7  CREATININE 0.75  CALCIUM 9.3   Liver Function Tests:  Recent Labs Lab 02/06/15 1224  AST 191*  ALT 216*  ALKPHOS 95  BILITOT 1.0  PROT 7.6  ALBUMIN 4.2   CBC:  Recent Labs Lab 02/06/15 1224  WBC 10.7*  NEUTROABS 7.1  HGB 17.3*  HCT 48.4  MCV 90.6  PLT 235   CBG:  Recent Labs Lab 02/06/15 1412  GLUCAP 115*   Fasting Lipid Panel:  Recent Labs Lab 02/07/15 0700  CHOL 182  HDL 25*  LDLCALC 80  TRIG 161*  CHOLHDL 7.3   Coagulation:  Recent  Labs Lab 02/06/15 1224  LABPROT 14.1  INR 1.07   Micro Results: No results found for this or any previous visit (from the past 240 hour(s)). Studies/Results: Ct Head (brain) Wo Contrast  02/06/2015   CLINICAL DATA:  Left-sided weakness and numbness  EXAM: CT HEAD WITHOUT CONTRAST  TECHNIQUE: Contiguous axial images were obtained from the base of the skull through the vertex without intravenous contrast.  COMPARISON:  None.  FINDINGS: Bony calvarium is intact. Mucosal retention cysts are noted within the maxillary antra bilaterally. Changes consistent with prior lacunar infarcts are noted in the midline of the pons as well as the right side of the pons. These would correspond with the patient's given clinical history of prior stroke-like symptoms. No findings to suggest acute hemorrhage, acute infarction or space-occupying mass lesion are noted.  IMPRESSION: Chronic changes as described.  No acute abnormality is noted.   Electronically Signed   By: Alcide Clever M.D.   On: 02/06/2015 13:28   Mr [name] Head Wo Contrast  02/06/2015   CLINICAL DATA:  39 year old male who awoke with left side numbness today. TIA. Initial encounter.  EXAM: MRI HEAD WITHOUT CONTRAST  MRA HEAD WITHOUT CONTRAST  TECHNIQUE: Multiplanar, multiecho pulse sequences of the brain and surrounding structures were obtained without intravenous contrast. Angiographic images of  the head were obtained using MRA technique without contrast.  COMPARISON:  CT head without contrast 1250 hours today.  FINDINGS: MRI HEAD FINDINGS  There is confluent restricted diffusion in the right paracentral pons with corresponding T2 and FLAIR hyperintensity. This is just lateral and cephalad to a chronic more central pontine lacunar infarct. No associated hemorrhage or significant mass effect.  No other restricted diffusion. Major intracranial vascular flow voids are preserved. However, there is enlargement of the basilar artery tip measuring up to 6-7 mm. See MRA  findings below.  No other posterior fossa chronic ischemic disease. Supratentorial gray and white matter signal is within normal limits. No midline shift, mass effect, evidence of mass lesion, ventriculomegaly, extra-axial collection or acute intracranial hemorrhage. Cervicomedullary junction and pituitary are within normal limits. Negative visualized cervical spine.  Visible internal auditory structures appear normal. Mastoids are clear. Left greater than right maxillary sinus mucous retention cyst. Visualized orbit soft tissues are within normal limits. Negative scalp soft tissues. Bone marrow signal within normal limits.  MRA HEAD FINDINGS  Antegrade flow in the distal vertebral arteries, the left is mildly dominant. The distal right vertebral artery is diminutive beyond the patent right PICA origin. No distal vertebral artery stenosis. Patent vertebrobasilar junction. Mild basilar artery irregularity with no stenosis.  Heterogeneous flow signal within the distal basilar artery compatible with an up to 8 mm basilar tip aneurysm involving the PCA origins, more so the left. Posterior communicating arteries are diminutive or absent. Bilateral PCA branches remain within normal limits.  Antegrade flow in both ICA siphons. No siphon stenosis. Ophthalmic artery origins are within normal limits. Normal carotid termini, MCA and ACA origins. Anterior communicating artery and visualized ACA branches within normal limits. Visualized bilateral MCA branches are within normal limits.  IMPRESSION: 1. Acute on chronic brainstem ischemia; Acute right paracentral pons lacunar infarct. No associated mass effect or hemorrhage. 2. Basilar tip aneurysm, 8 mm. Mild basilar artery irregularity elsewhere, no posterior circulation stenosis or major circle of Willis branch occlusion identified.   Electronically Signed   By: Odessa Fleming M.D.   On: 02/06/2015 18:21   Mr Brain Wo Contrast  02/06/2015   CLINICAL DATA:  39 year old male who awoke  with left side numbness today. TIA. Initial encounter.  EXAM: MRI HEAD WITHOUT CONTRAST  MRA HEAD WITHOUT CONTRAST  TECHNIQUE: Multiplanar, multiecho pulse sequences of the brain and surrounding structures were obtained without intravenous contrast. Angiographic images of the head were obtained using MRA technique without contrast.  COMPARISON:  CT head without contrast 1250 hours today.  FINDINGS: MRI HEAD FINDINGS  There is confluent restricted diffusion in the right paracentral pons with corresponding T2 and FLAIR hyperintensity. This is just lateral and cephalad to a chronic more central pontine lacunar infarct. No associated hemorrhage or significant mass effect.  No other restricted diffusion. Major intracranial vascular flow voids are preserved. However, there is enlargement of the basilar artery tip measuring up to 6-7 mm. See MRA findings below.  No other posterior fossa chronic ischemic disease. Supratentorial gray and white matter signal is within normal limits. No midline shift, mass effect, evidence of mass lesion, ventriculomegaly, extra-axial collection or acute intracranial hemorrhage. Cervicomedullary junction and pituitary are within normal limits. Negative visualized cervical spine.  Visible internal auditory structures appear normal. Mastoids are clear. Left greater than right maxillary sinus mucous retention cyst. Visualized orbit soft tissues are within normal limits. Negative scalp soft tissues. Bone marrow signal within normal limits.  MRA HEAD FINDINGS  Antegrade flow in the distal vertebral arteries, the left is mildly dominant. The distal right vertebral artery is diminutive beyond the patent right PICA origin. No distal vertebral artery stenosis. Patent vertebrobasilar junction. Mild basilar artery irregularity with no stenosis.  Heterogeneous flow signal within the distal basilar artery compatible with an up to 8 mm basilar tip aneurysm involving the PCA origins, more so the left.  Posterior communicating arteries are diminutive or absent. Bilateral PCA branches remain within normal limits.  Antegrade flow in both ICA siphons. No siphon stenosis. Ophthalmic artery origins are within normal limits. Normal carotid termini, MCA and ACA origins. Anterior communicating artery and visualized ACA branches within normal limits. Visualized bilateral MCA branches are within normal limits.  IMPRESSION: 1. Acute on chronic brainstem ischemia; Acute right paracentral pons lacunar infarct. No associated mass effect or hemorrhage. 2. Basilar tip aneurysm, 8 mm. Mild basilar artery irregularity elsewhere, no posterior circulation stenosis or major circle of Willis branch occlusion identified.   Electronically Signed   By: Odessa FlemingH  Hall M.D.   On: 02/06/2015 18:21   Medications: I have reviewed the patient's current medications. Scheduled Meds: . aspirin EC  325 mg Oral Daily  . atorvastatin  20 mg Oral q1800  . enoxaparin (LOVENOX) injection  40 mg Subcutaneous Q24H  . nicotine  21 mg Transdermal Daily   Continuous Infusions:  PRN Meds:. Assessment/Plan:  CVA- Seen on MRI- pontine stroke, prior stroke also pontine which is unusual also considering his age. With improving but persistent deficits. Stroke work up, including hypercoagulable still underway. So far lipid panel with elevated triglycerides. Risk factors- So far Obesity, dyslipidemia and Smoking.Given patient age, and prior history of CVA, will pursue extensive workup including hypercoagulable panel. -Hga1C 8.2 -telemetry - Echo: EF 55-60%, normal systolic function -carotid artery dopplers before d/c - Hypercoagulable panel- homocysteine, Antiphospholipid, Anthithrombin, protein c, protein s, total and activity, Lupus anticoag, antithrombin 3, B2 glycoprotein, factor 5 leiden. So far antithrombin 3 negative. - PT/OT consult- out patient follow up - Aspirin 325mg  daily - Neuro consult- appreciate recs - Also considering slight  erythrocytosis and leukocytosis, will work up with JAK2 for polycythemia vera/myeloproliferative dsd, though platelet WNL, but eythrocytosis may be from smoking. Will also check Carboxyhemoglobin level.  Dyslipidemia- TG- 387, LDL- 80, total- 182, HDL- 25. - Will start Atorvastatoin at 20mg  daily.  Tobacco use:  - Nicotine patch -Counselled on cessation  Elevated liver enzymes: Hep panel negative. Possible fatty liver, will need to be followed in the outpatient setting.   DVT prophylaxis -lovenox  Dispo: Disposition is deferred at this time, awaiting improvement of current medical problems.  Anticipated discharge in approximately 1-2 day(s).   The patient does not have a current PCP (No primary care provider on file.) and [facility] after discharge.  The patient does not know have transportation limitations that hinder transportation to clinic appointments.  .Services Needed at time of discharge: Y = Yes, Blank = No PT:   OT:   RN:   Equipment:   Other:     LOS: 2 days   Juanetta GoslingKatherine R Riyah Bardon, Med Student 02/08/2015, 10:15 AM

## 2015-02-08 NOTE — Progress Notes (Signed)
Patient d/c home, d/c instructions given and patient verbalized understanding. Condition stable 

## 2015-02-08 NOTE — Progress Notes (Signed)
Subjective: Stable, no new deficits. Has some paraesthesia, mild weakness still present in left hand. Otherwise stable. Wife at bedside. Has extensive discussion with patient about life style changes she will have to make including Quitting smoking, Starting new meds- Aspirin, statin and metformin. Watching his diet, exercise, loosing weight, following up with PCP- here in our clinic, and with neurologist. They both voiced understanding.  Objective: Vital signs in last 24 hours: Filed Vitals:   02/07/15 1806 02/07/15 2026 02/08/15 0207 02/08/15 0528  BP: 143/74 131/73 107/41 92/41  Pulse: 96 87 68 73  Temp: 99.1 F (37.3 C) 97.8 F (36.6 C) 97.9 F (36.6 C) 98.1 F (36.7 C)  TempSrc: Oral Oral Oral Oral  Resp: Height:      Weight:      SpO2: 99% 95% 99% 98%   Weight change:   Intake/Output Summary (Last 24 hours) at 02/08/15 1143 Last data filed at 02/07/15 1900  Gross per 24 hour  Intake    600 ml  Output      0 ml  Net    600 ml   General appearance:lying in bed, significant other at bedside, alert, cooperative, appears stated age and no distress Head: Normocephalic, without obvious abnormality, atraumatic, PERRL, EOMI. Lungs: clear to auscultation bilaterally Heart: regular rate and rhythm, S1, S2 normal, no murmur, click, rub or gallop. Abdomen: soft, non-tender; bowel sounds normal; no masses,  no organomegaly Extremities: extremities normal, atraumatic, no cyanosis or edema Neurologic: Alert and oriented , strenght 5/5- right upper and lower, 4+/5 strenght left upper, 5/5 lower.    Lab Results: Basic Metabolic Panel:  Recent Labs Lab 02/06/15 1224  NA 140  K 3.9  CL 102  CO2 29  GLUCOSE 125*  BUN 7  CREATININE 0.75  CALCIUM 9.3   Liver Function Tests:  Recent Labs Lab 02/06/15 1224  AST 191*  ALT 216*  ALKPHOS 95  BILITOT 1.0  PROT 7.6  ALBUMIN 4.2   CBC:  Recent Labs Lab 02/06/15 1224  WBC 10.7*  NEUTROABS 7.1  HGB 17.3*    HCT 48.4  MCV 90.6  PLT 235   CBG:  Recent Labs Lab 02/06/15 1412  GLUCAP 115*   Fasting Lipid Panel:  Recent Labs Lab 02/07/15 0700  CHOL 182  HDL 25*  LDLCALC 80  TRIG 161*  CHOLHDL 7.3   Coagulation:  Recent Labs Lab 02/06/15 1224  LABPROT 14.1  INR 1.07   Micro Results: No results found for this or any previous visit (from the past 240 hour(s)). Studies/Results: Ct Head (brain) Wo Contrast  02/06/2015   CLINICAL DATA:  Left-sided weakness and numbness  EXAM: IMPRESSION: Chronic changes as described.  No acute abnormality is noted.   Electronically Signed   By: Alcide Clever M.D.   On: 02/06/2015 13:28   Mr [name] Head Wo Contrast  02/06/2015   CLINICAL DATA:  39 year old male who awoke with left side numbness today.IMPRESSION: 1. Acute on chronic brainstem ischemia; Acute right paracentral pons lacunar infarct. No associated mass effect or hemorrhage. 2. Basilar tip aneurysm, 8 mm. Mild basilar artery irregularity elsewhere, no posterior circulation stenosis or major circle of Willis branch occlusion identified.   Electronically Signed   By: Odessa Fleming M.D.   On: 02/06/2015 18:21   Mr Brain Wo Contrast  02/06/2015   CLINICAL DATA:  39 year old male who awoke with left side numbness today.  IMPRESSION: 1. Acute on chronic brainstem ischemia; Acute right  paracentral pons lacunar infarct. No associated mass effect or hemorrhage. 2. Basilar tip aneurysm, 8 mm. Mild basilar artery irregularity elsewhere, no posterior circulation stenosis or major circle of Willis branch occlusion identified.   Electronically Signed   By: Odessa FlemingH  Hall M.D.   On: 02/06/2015 18:21   Medications: I have reviewed the patient's current medications. Scheduled Meds: . aspirin EC  325 mg Oral Daily  . atorvastatin  20 mg Oral q1800  . enoxaparin (LOVENOX) injection  40 mg Subcutaneous Q24H  . nicotine  21 mg Transdermal Daily   Continuous Infusions:  PRN Meds:. Assessment/Plan:  CVA- Seen on MRI-  pontine stroke, prior stroke also pontine, With improving but persistent deficits. Stroke work up, including hypercoagulable still underway. So far lipid panel with elevated triglycerides. Risk factors- So far Obesity, dyslipidemia, diabetes and Smoking.Given patient age, and prior history of CVA, will pursue extensive workup including hypercoagulable panel. -Hga1C-8.2, start metformin 500mg  daily on discharge. -telemetry- Had some 2 runs of paired PVCs. - Echo- No thrombus identified, EF- 55-60%, no remark on PFO. -carotid artery dopplers - pending - Hypercoagulable panel- homocysteine, Antiphospholipid, Anthithrombin, protein c, protein s, total and activity, Lupus anticoag, antithrombin 3, B2 glycoprotein, factor 5 leiden. So far antithrombin 3 negative. - PT/OT consult- out patient follow up - Regular diet after until passes RN bed side swallow - Aspirin 325mg  daily - Neuro consult- appreciate recs - Also considering slight erythrocytosis and leukocytosis, will work up with JAK2 for polycythemia vera/myeloproliferative dsd, though platelet WNL, but eythrocytosis may be from smoking. Will also check Carboxyhemoglobin level. - Appointment to follow up in clinic with us, possibly apply for orange card.  Dyslipidemia- TG- 387, LDL- 80, total- 182, HDL- 25. - Will start Atorvastatoin at 20mg  daily.  Elevated liver enzymes- AST- 191, ALT- 216, ALP- 95, etiology not identified at this time. Acute hep panel- negative.  - Possible NAFLD. - Follow up as out patient - Pt on statin- atorvastatin 20mg  daily, watch liver enzymes  Diabetes- HgbA1c- 8.2.  - Start metformin 500mg  daily, uptitrate on follow up.  Tobacco use:  - Nicotine patch -Counselled on cessation  DVT prophylaxis -lovenox  Dispo: Disposition is deferred at this time, awaiting improvement of current medical problems.  Anticipated discharge approximately today.  The patient does not have a current PCP (No primary care  provider on file.) and [facility] after discharge.  The patient does not know have transportation limitations that hinder transportation to clinic appointments.  .Services Needed at time of discharge: Y = Yes, Blank = No PT:   OT:   RN:   Equipment:   Other:     LOS: 2 days   Onnie BoerEjiroghene E Abie Cheek, MD 02/08/2015, 11:43 AM

## 2015-02-08 NOTE — Progress Notes (Signed)
Physical Therapy Treatment Patient Details Name: Bobby Thompson MRN: [id number] DOB: 1976-06-02 Today's Date: 02/08/2015    History of Present Illness adm with Lt sided numbness; MRI + acute on chronic brainstem ischemia; + Rt pons infarct; 8 mm aneurysm basilar artery  PMHx- tobacco user    PT Comments    Balance improved and pt denies vertigo/spinning this date. Continue to recommend OPPT as pt climbs ladders/has very physical job.  Follow Up Recommendations  Outpatient PT;Supervision - Intermittent     Equipment Recommendations  None recommended by PT    Recommendations for Other Services OT consult     Precautions / Restrictions Precautions Precautions: Fall    Mobility  Bed Mobility Overal bed mobility: Needs Assistance Bed Mobility: Rolling;Sidelying to Sit Rolling: Modified independent (Device/Increase time) Sidelying to sit: Supervision       General bed mobility comments: no rail, HOB 0, struggling with supine to sit due to weak torso; vc for sidelying to sit technique   Transfers Overall transfer level: Needs assistance Equipment used: None Transfers: Sit to/from Stand Sit to Stand: Modified independent (Device/Increase time)         General transfer comment: no unsteadiness: denied dizziness/vertigo today  Ambulation/Gait Ambulation/Gait assistance: Supervision Ambulation Distance (Feet): 300 Feet Assistive device: None Gait Pattern/deviations: WFL(Within Functional Limits) Gait velocity: able to vary up/down Gait velocity interpretation: at or above normal speed for age/gender General Gait Details: slightly guarded gait; reports numbness lt foot is better but still present; able to perform head turns and maintain straight path;    Stairs            Wheelchair Mobility    Modified Rankin (Stroke Patients Only) Modified Rankin (Stroke Patients Only) Pre-Morbid Rankin Score: No symptoms Modified Rankin: Slight disability      Balance Overall balance assessment: Needs assistance         Standing balance support: No upper extremity supported Standing balance-Leahy Scale: Good               High level balance activites: Backward walking;Side stepping;Turns;Sudden stops;Head turns High Level Balance Comments: no unsteadiness noted    Cognition Arousal/Alertness: Awake/alert Behavior During Therapy: WFL for tasks assessed/performed Overall Cognitive Status: Within Functional Limits for tasks assessed                      Exercises General Exercises - Lower Extremity Hip Flexion/Marching: AROM;Both;10 reps;Standing Toe Raises: AROM;Both;5 reps;Standing Heel Raises: AROM;Both;5 reps;Standing    General Comments        Pertinent Vitals/Pain Pain Assessment: No/denies pain    Home Living                      Prior Function            PT Goals (current goals can now be found in the care plan section) Acute Rehab PT Goals Patient Stated Goal: ultimately return to work (when safe) PT Goal Formulation: With patient Time For Goal Achievement: 02/10/15 Potential to Achieve Goals: Good Progress towards PT goals: Progressing toward goals    Frequency  Min 4X/week    PT Plan Current plan remains appropriate    Co-evaluation             End of Session Equipment Utilized During Treatment: Gait belt Activity Tolerance: Patient tolerated treatment well Patient left: in chair;with call bell/phone within reach;with family/visitor present;with chair alarm set     Time: 4098-1191 PT Time Calculation (min) (ACUTE  ONLY): 14 min  Charges:  $Gait Training: 8-22 mins                    G Codes:      Bobby Thompson 02/08/2015, 12:42 PM Pager [phone]

## 2015-02-08 NOTE — Care Management Note (Signed)
    Page 1 of 1   02/08/2015     11:26:33 AM CARE MANAGEMENT NOTE 02/08/2015  Patient:  Bobby Thompson, Bobby Thompson   Account Number:  [id number]  Date Initiated:  02/07/2015  Documentation initiated by:  Lorne Skeens  Subjective/Objective Assessment:   pt was admitted with cva, lives at home with spouse, pt is without insurance     Action/Plan:   will follow for discharge needs   Anticipated DC Date:  02/09/2015   Anticipated DC Plan:  HOME/SELF CARE  In-house referral  [place]  CM consult  Medication [facility]      Choice offered to / List presented to:             Status of service:  In process, will continue to follow Medicare Important Message given?   (If response is "NO", the following Medicare IM given date fields will be blank) Date Medicare IM given:   Medicare IM given by:   Date Additional Medicare IM given:   Additional Medicare IM given by:    Discharge Disposition:    Per UR Regulation:  Reviewed for med. necessity/level of care/duration of stay  If discussed at [place] of Stay Meetings, dates discussed:    Comments:  02/08/15 [place], MSN, CM- Met with patient and wife to discuss discharge planning needs. Patient was given information on the [facility]. He was informed that he will need to arrive at 0830 the day after discharge to establish care.  Patient was informed that his medications can be filled in the [facility] clinic as long as he continues to follow at the [facility]. Patient verbalized understanding.  CM will continue to follow for any additional discharge discharge needs.   02/07/15 15:00 Lorne Skeens RN, MSN, CM- financial counselor following chart per notation.

## 2015-02-08 NOTE — Progress Notes (Signed)
STROKE TEAM PROGRESS NOTE   HISTORY Bobby Thompson is an 39 y.o. male with no significant PMHx other than tobacco abuse. Patient noted yesterday 02/05/2015 (time unknown) he had new onset of decreased sensation in his left leg, arm and face. He did not seek medical attention until today 02/05/2105 when he noted the symptoms did not dissipate. He states he has had similar symptoms in the past but never sought medical attention. He does not take ASA daily. Patient was not administered TPA secondary to delay in arrival. He was admitted for further evaluation and treatment.   SUBJECTIVE (INTERVAL HISTORY) Wife at the bedside. Working with PT.   OBJECTIVE Temp:  [97.8 F (36.6 C)-99.1 F (37.3 C)] 98.1 F (36.7 C) (03/23 0528) Pulse Rate:  [68-104] 73 (03/23 0528) Cardiac Rhythm:  [-] Normal sinus rhythm (03/23 0800) Resp:  [16-20] 20 (03/23 0528) BP: (92-143)/(41-79) 92/41 mmHg (03/23 0528) SpO2:  [95 %-99 %] 98 % (03/23 0528)   Recent Labs Lab 02/06/15 1412  GLUCAP 115*    Recent Labs Lab 02/06/15 1224  NA 140  K 3.9  CL 102  CO2 29  GLUCOSE 125*  BUN 7  CREATININE 0.75  CALCIUM 9.3    Recent Labs Lab 02/06/15 1224  AST 191*  ALT 216*  ALKPHOS 95  BILITOT 1.0  PROT 7.6  ALBUMIN 4.2    Recent Labs Lab 02/06/15 1224  WBC 10.7*  NEUTROABS 7.1  HGB 17.3*  HCT 48.4  MCV 90.6  PLT 235   No results for input(s): CKTOTAL, CKMB, CKMBINDEX, TROPONINI in the last 168 hours.  Recent Labs  02/06/15 1224  LABPROT 14.1  INR 1.07   No results for input(s): COLORURINE, LABSPEC, PHURINE, GLUCOSEU, HGBUR, BILIRUBINUR, KETONESUR, PROTEINUR, UROBILINOGEN, NITRITE, LEUKOCYTESUR in the last 72 hours.  Invalid input(s): APPERANCEUR     Component Value Date/Time   CHOL 182 02/07/2015 0700   TRIG 387* 02/07/2015 0700   HDL 25* 02/07/2015 0700   CHOLHDL 7.3 02/07/2015 0700   VLDL 77* 02/07/2015 0700   LDLCALC 80 02/07/2015 0700   Lab Results  Component Value  Date   HGBA1C 8.2* 02/06/2015   No results found for: LABOPIA, COCAINSCRNUR, LABBENZ, AMPHETMU, THCU, LABBARB  No results for input(s): ETH in the last 168 hours.  Ct Head (brain) Wo Contrast  02/06/2015   CLINICAL DATA:  Left-sided weakness and numbness  EXAM: CT HEAD WITHOUT CONTRAST  TECHNIQUE: Contiguous axial images were obtained from the base of the skull through the vertex without intravenous contrast.  COMPARISON:  None.  FINDINGS: Bony calvarium is intact. Mucosal retention cysts are noted within the maxillary antra bilaterally. Changes consistent with prior lacunar infarcts are noted in the midline of the pons as well as the right side of the pons. These would correspond with the patient's given clinical history of prior stroke-like symptoms. No findings to suggest acute hemorrhage, acute infarction or space-occupying mass lesion are noted.  IMPRESSION: Chronic changes as described.  No acute abnormality is noted.   Electronically Signed   By: Alcide CleverMark  Lukens M.D.   On: 02/06/2015 13:28   Mr Bobby GlennMra Head Wo Contrast  02/06/2015   CLINICAL DATA:  39 year old male who awoke with left side numbness today. TIA. Initial encounter.  EXAM: MRI HEAD WITHOUT CONTRAST  MRA HEAD WITHOUT CONTRAST  TECHNIQUE: Multiplanar, multiecho pulse sequences of the brain and surrounding structures were obtained without intravenous contrast. Angiographic images of the head were obtained using MRA technique without contrast.  COMPARISON:  CT head without contrast 1250 hours today.  FINDINGS: MRI HEAD FINDINGS  There is confluent restricted diffusion in the right paracentral pons with corresponding T2 and FLAIR hyperintensity. This is just lateral and cephalad to a chronic more central pontine lacunar infarct. No associated hemorrhage or significant mass effect.  No other restricted diffusion. Major intracranial vascular flow voids are preserved. However, there is enlargement of the basilar artery tip measuring up to 6-7 mm. See  MRA findings below.  No other posterior fossa chronic ischemic disease. Supratentorial gray and white matter signal is within normal limits. No midline shift, mass effect, evidence of mass lesion, ventriculomegaly, extra-axial collection or acute intracranial hemorrhage. Cervicomedullary junction and pituitary are within normal limits. Negative visualized cervical spine.  Visible internal auditory structures appear normal. Mastoids are clear. Left greater than right maxillary sinus mucous retention cyst. Visualized orbit soft tissues are within normal limits. Negative scalp soft tissues. Bone marrow signal within normal limits.  MRA HEAD FINDINGS  Antegrade flow in the distal vertebral arteries, the left is mildly dominant. The distal right vertebral artery is diminutive beyond the patent right PICA origin. No distal vertebral artery stenosis. Patent vertebrobasilar junction. Mild basilar artery irregularity with no stenosis.  Heterogeneous flow signal within the distal basilar artery compatible with an up to 8 mm basilar tip aneurysm involving the PCA origins, more so the left. Posterior communicating arteries are diminutive or absent. Bilateral PCA branches remain within normal limits.  Antegrade flow in both ICA siphons. No siphon stenosis. Ophthalmic artery origins are within normal limits. Normal carotid termini, MCA and ACA origins. Anterior communicating artery and visualized ACA branches within normal limits. Visualized bilateral MCA branches are within normal limits.  IMPRESSION: 1. Acute on chronic brainstem ischemia; Acute right paracentral pons lacunar infarct. No associated mass effect or hemorrhage. 2. Basilar tip aneurysm, 8 mm. Mild basilar artery irregularity elsewhere, no posterior circulation stenosis or major circle of Willis branch occlusion identified.   Electronically Signed   By: Odessa Fleming M.D.   On: 02/06/2015 18:21   Mr Brain Wo Contrast  02/06/2015   CLINICAL DATA:  39 year old male who  awoke with left side numbness today. TIA. Initial encounter.  EXAM: MRI HEAD WITHOUT CONTRAST  MRA HEAD WITHOUT CONTRAST  TECHNIQUE: Multiplanar, multiecho pulse sequences of the brain and surrounding structures were obtained without intravenous contrast. Angiographic images of the head were obtained using MRA technique without contrast.  COMPARISON:  CT head without contrast 1250 hours today.  FINDINGS: MRI HEAD FINDINGS  There is confluent restricted diffusion in the right paracentral pons with corresponding T2 and FLAIR hyperintensity. This is just lateral and cephalad to a chronic more central pontine lacunar infarct. No associated hemorrhage or significant mass effect.  No other restricted diffusion. Major intracranial vascular flow voids are preserved. However, there is enlargement of the basilar artery tip measuring up to 6-7 mm. See MRA findings below.  No other posterior fossa chronic ischemic disease. Supratentorial gray and white matter signal is within normal limits. No midline shift, mass effect, evidence of mass lesion, ventriculomegaly, extra-axial collection or acute intracranial hemorrhage. Cervicomedullary junction and pituitary are within normal limits. Negative visualized cervical spine.  Visible internal auditory structures appear normal. Mastoids are clear. Left greater than right maxillary sinus mucous retention cyst. Visualized orbit soft tissues are within normal limits. Negative scalp soft tissues. Bone marrow signal within normal limits.  MRA HEAD FINDINGS  Antegrade flow in the distal vertebral arteries, the left is mildly dominant.  The distal right vertebral artery is diminutive beyond the patent right PICA origin. No distal vertebral artery stenosis. Patent vertebrobasilar junction. Mild basilar artery irregularity with no stenosis.  Heterogeneous flow signal within the distal basilar artery compatible with an up to 8 mm basilar tip aneurysm involving the PCA origins, more so the left.  Posterior communicating arteries are diminutive or absent. Bilateral PCA branches remain within normal limits.  Antegrade flow in both ICA siphons. No siphon stenosis. Ophthalmic artery origins are within normal limits. Normal carotid termini, MCA and ACA origins. Anterior communicating artery and visualized ACA branches within normal limits. Visualized bilateral MCA branches are within normal limits.  IMPRESSION: 1. Acute on chronic brainstem ischemia; Acute right paracentral pons lacunar infarct. No associated mass effect or hemorrhage. 2. Basilar tip aneurysm, 8 mm. Mild basilar artery irregularity elsewhere, no posterior circulation stenosis or major circle of Willis branch occlusion identified.   Electronically Signed   By: Odessa Fleming M.D.   On: 02/06/2015 18:21   2D Echocardiogram  EF 55-60% with no source of embolus. No LV thrombus seen with definity.   PHYSICAL EXAM  obese young Hispanic male currently not in distress. . Afebrile. Head is nontraumatic. Neck is supple without bruit. Cardiac exam no murmur or gallop. Lungs are clear to auscultation. Distal pulses are well felt. Neurological Exam : Awake alert oriented 3 with normal speech and language function. Fundi were not visualized. Vision acuity seems adequate. Extraocular movements are full range but there is saccadic dysmetria on horizontal gaze ( left greater than right). No nystagmus. Face is symmetric without weakness. Tongue is midline. Motor system exam revealed no upper or lower eczema to drift but mild weakness of left grip and intrinsic hand muscles. Orbits right over left upper extremity. Minimum weakness of left hip flexors and ankle dorsiflexors. Subjective decrease left hemibody sensation. Mild impaired left finger-to-nose coordination. Gait was not tested. Plantars are downgoing.    ASSESSMENT/PLAN Bobby Thompson is a 39 y.o. male with no real medical history other than smoking who presents with left sided weakness and  numbness. He did not receive IV t-PA due to delay in arrival.   Stroke:  Non-dominant right paracentral pontine lacunar infarct secondary to small vessel disease source  Resultant  Left hand clumsiness  MRI  Acute R paracentral pontine lacunar infarct  MRA  8mm basilar tip aneurysm   Carotid Doppler  pending   2D Echo  No source of embolus   Hypercoagulable labs pending, thus far neg   Lovenox 40 mg sq daily for VTE prophylaxis  Diet regular thin liquids  no antithrombotic prior to admission, now on aspirin 325 mg orally every day  Patient counseled to be compliant with his antithrombotic medications  Ongoing aggressive stroke risk factor management  Therapy recommendations:  OP PT & OT   Disposition:  Home with OP therapy  Works as Production designer, 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 Needs to improve prior to returning to work. Follow-up Stroke Clinic at [facility] with Dr. Delia Heady in 2 months, order placed.   Basilar tip aneurysm  Incidental finding, asymptomatic  Monitor over time  Consider intervention in the future  For now, Stop smoking, keep BP under control, avoid etoh  Risk of rupture 1% per year  Follow up with neurology Pearlean Brownie) to discuss treatment options, in 2 months (order written)  Hyperlipidemia  Home meds:  No statin  LDL 80, goal < 70  New lipitor 20 added  Continue statin at  discharge  Elevated LFTs AST 191*  ALT 216*   Diabetes  HgbA1c 8.2  New diagnosis  Other Stroke Risk Factors  Cigarette smoker, advised to stop smoking  ETOH use  Obesity, Body mass index is 36.8 kg/(m^2).   Hospital day # 2  Rhoderick Moody [facility] See Amion for Pager information 02/08/2015 9:55 AM  I have personally examined this patient, reviewed notes, independently viewed imaging studies, participated in medical decision making and plan of care. I have made any additions or clarifications directly to the above note.  Agree with note above. Mobilize out of bed. Likely DC home. F/U as outpatient in 2 months.  Delia Heady, MD Medical Director [facility] Pager: [phone] 02/08/2015 3:48 PM    To contact Stroke Continuity provider, please refer to [email or web address]. After hours, contact General Neurology

## 2015-02-09 ENCOUNTER — Ambulatory Visit: Payer: Medicaid Other | Attending: Family Medicine | Admitting: Family Medicine

## 2015-02-09 VITALS — BP 132/91 | HR 77 | Temp 97.5°F | Resp 16 | Ht 67.0 in | Wt 248.0 lb

## 2015-02-09 DIAGNOSIS — E119 Type 2 diabetes mellitus without complications: Secondary | ICD-10-CM | POA: Diagnosis not present

## 2015-02-09 DIAGNOSIS — E1165 Type 2 diabetes mellitus with hyperglycemia: Secondary | ICD-10-CM

## 2015-02-09 DIAGNOSIS — R29810 Facial weakness: Secondary | ICD-10-CM | POA: Diagnosis present

## 2015-02-09 DIAGNOSIS — IMO0002 Reserved for concepts with insufficient information to code with codable children: Secondary | ICD-10-CM

## 2015-02-09 DIAGNOSIS — F17210 Nicotine dependence, cigarettes, uncomplicated: Secondary | ICD-10-CM | POA: Insufficient documentation

## 2015-02-09 DIAGNOSIS — Z8673 Personal history of transient ischemic attack (TIA), and cerebral infarction without residual deficits: Secondary | ICD-10-CM | POA: Insufficient documentation

## 2015-02-09 DIAGNOSIS — I639 Cerebral infarction, unspecified: Secondary | ICD-10-CM

## 2015-02-09 LAB — PROTEIN C, TOTAL: Protein C, Total: 111 % (ref 70–140)

## 2015-02-09 LAB — CARDIOLIPIN ANTIBODIES, IGG, IGM, IGA
Anticardiolipin IgG: <9 GPL U/mL (ref 0–14)
Anticardiolipin IgM: <9 MPL U/mL (ref 0–12)

## 2015-02-09 LAB — BETA-2-GLYCOPROTEIN I ABS, IGG/M/A
Beta-2 Glyco I IgG: <9 GPI IgG units (ref 0–20)
Beta-2-Glycoprotein I IgM: <9 GPI IgM units (ref 0–32)

## 2015-02-09 LAB — PROTEIN C ACTIVITY: Protein C Activity: 146 % (ref 74–151)

## 2015-02-09 LAB — PROTHROMBIN GENE MUTATION

## 2015-02-09 LAB — LUPUS ANTICOAGULANT PANEL
DRVVT: 31.8 s (ref 0.0–55.1)
PTT LA: 35.5 s (ref 0.0–50.0)

## 2015-02-09 LAB — FACTOR 5 LEIDEN

## 2015-02-09 LAB — PROTEIN S, TOTAL: Protein S Ag, Total: 131 % (ref 58–150)

## 2015-02-09 LAB — PROTEIN S ACTIVITY: Protein S Activity: 95 % (ref 60–145)

## 2015-02-09 LAB — GLUCOSE, POCT (MANUAL RESULT ENTRY): POC GLUCOSE: 116 mg/dL — AB (ref 70–99)

## 2015-02-09 MED ORDER — ATORVASTATIN CALCIUM 20 MG PO TABS: 20.0000 mg | ORAL_TABLET | Freq: Every day | ORAL | Status: DC

## 2015-02-09 NOTE — Progress Notes (Signed)
HFU Pt suffered a stroke. Pt states that he has numbness in his extremity's. Pt is requesting nicotine patches. Pt said that he was exercising when he started feeling symptoms of the stroke.

## 2015-02-09 NOTE — Progress Notes (Addendum)
Patient ID: Bobby Thompson, male   DOB: 11/25/1975, 39 y.o.   MRN: [id number] [facility] Complaint:  Hospital follow-up SP lunar infarct. To establish care   Subjective:  Patient presents for hospital follow-up. He was hospitalized from March 21 - March 23. His presenting symptoms were a mild headache and left side numbness and weakness. He was also found to have left facial droop. His symptoms resolved quickly and he was discharged two days later. His only remaining deficit is left hand weakness. He feels 90% back to normal. He was also found to have an elevated BS and a A.C of 8.9.  He was started on ASA, metformin, atorvastatin and a nicotine patch for smoking cessation. He reports his atorvastatin did not get called it and ask for me to prescribe that here. He would also like nicotine patches. He has contacted [employer] [facility] and should have a supply soon but he does not have insurance in order to get today.   ROS:  GEN:   Denies fever, chills,WT loss Skin:   Denies lesions or rashes HENT:   Denies  earache, epistaxis, sore throat, or neck pain, or headaches                LUNGS:  Denies SOB with rest or walking CV:   Denies CP or palpitations ABD:   Denies abdominal pain, nausea,and vomiting            EXT:    Denies muscle spasms or swelling; no pain in lower ext, no weakness NEURO:   Denies numbness or tingling, denies seizures  Objective:  Filed Vitals:   02/09/15 1128  BP: 132/91  Pulse: 77  Temp: 97.5 F (36.4 C)  TempSrc: Oral  Resp: 16  Height:  (1.702 m)  Weight: 248 lb (112.492 kg)  SpO2: 98%    Physical Exam:  General:  in no acute distress. HEENT:  no pallor, no icterus, moist oral mucosa, no  lymphadenopathy Heart:   Normal  s1 &s2  Regular rate and rhythm, without M,G,R Lungs:   Clear to auscultation bilaterally. Abdomen:  Soft, nontender, nondistended, positive bowel sounds. Exetremeties:  No pedal edema,pedal pulses normal. Neuro:   Alert, awake, oriented  x3, nonfocal.cranial nerves II-XII intact, PERL, EOMS intact. Grips slight weakness on left, No arm drift. Tandem and Romberg negative. Pertinent Lab Results:   Medications: Prior to Admission medications   Medication Sig Start Date End Date Taking? Authorizing Provider  aspirin EC 81 MG EC tablet Take 1 tablet (81 mg total) by mouth daily. 02/08/15  Yes Ejiroghene E Emokpae, MD  metFORMIN (GLUCOPHAGE) 500 MG tablet Take 1 tablet (500 mg total) by mouth daily. 02/08/15  Yes Ejiroghene Wendall Stade, MD  atorvastatin (LIPITOR) 20 MG tablet Take 1 tablet (20 mg total) by mouth daily at 6 PM. Patient not taking: Reported on 02/09/2015 02/08/15   Ejiroghene E Emokpae, MD  nicotine (NICODERM CQ - DOSED IN MG/24 HOURS) 21 mg/24hr patch Place 1 patch (21 mg total) onto the skin daily. Patient not taking: Reported on 02/09/2015 02/08/15   Onnie Boer, MD    Assessment: 1. S/P lunar infarct 2. Diabetes 3. Need for primary care.  Plan:  1. Keep appointment with neuro 2&3  Followup here with PCP   Follow up:  The patient was given clear instructions to go to ER or return to medical center if symptoms don't improve, worsen or new problems develop. The patient verbalized understanding. The patient was told to call  to get lab results if they haven't heard anything in the next week.   This note has been created with Education officer, environmentalDragon speech recognition software and smart phrase technology. Any transcriptional errors are unintentional.   Henrietta HooverLinda C. Evora Schechter, FNP,BC 02/09/2015, 11:48 AM

## 2015-02-09 NOTE — Patient Instructions (Addendum)
L Review instructions from hospital and keep all appointments. 2. Make appointment here with assigned primary doctor in 2 weeks. 3. Return to ED for return of previous or new symptoms of a stroke. 4. Continue to work on smoking cessation.

## 2015-02-10 DIAGNOSIS — E785 Hyperlipidemia, unspecified: Secondary | ICD-10-CM | POA: Diagnosis present

## 2015-02-10 DIAGNOSIS — E1165 Type 2 diabetes mellitus with hyperglycemia: Secondary | ICD-10-CM | POA: Diagnosis present

## 2015-02-10 DIAGNOSIS — IMO0002 Reserved for concepts with insufficient information to code with codable children: Secondary | ICD-10-CM | POA: Diagnosis present

## 2015-02-10 LAB — JAK2 GENOTYPR

## 2015-02-10 NOTE — Assessment & Plan Note (Addendum)
02/10/2015 Newly diagnosed with Diabetes- with HgBA1c- 8.2.

## 2015-02-21 ENCOUNTER — Ambulatory Visit: Payer: Self-pay | Admitting: Internal Medicine

## 2015-02-21 ENCOUNTER — Encounter: Payer: Self-pay | Admitting: Internal Medicine

## 2015-02-21 VITALS — BP 131/76 | HR 74 | Temp 97.9°F | Ht 67.0 in | Wt 253.7 lb

## 2015-02-21 LAB — GLUCOSE, CAPILLARY: Glucose-Capillary: 182 mg/dL — ABNORMAL HIGH (ref 70–99)

## 2015-02-21 NOTE — Progress Notes (Signed)
   Subjective:    Patient ID: Bobby Thompson, male    DOB: 01/16/1976, 39 y.o.   MRN: [id number]  HPI Comments: No charge.  The patient has already been seen for hospital follow-up and to [facility] care at [facility]  He came to this visit because it was previously scheduled for him but he is comfortable continuing care at [facility].       Review of Systems     Objective:   Physical Exam        Assessment & Plan:

## 2015-02-21 NOTE — Progress Notes (Signed)
Agree with Dr. Andrey CampanileWilson that Bobby Thompson was recently seen for a hospital discharge follow-up with his new PCP which he has established with and has scheduled follow-up.  No need to be seen in the Internal Medicine Center at this time.  No charge.

## 2015-02-23 ENCOUNTER — Ambulatory Visit: Payer: Self-pay | Attending: Family Medicine | Admitting: Family Medicine

## 2015-02-23 ENCOUNTER — Encounter: Payer: Self-pay | Admitting: Family Medicine

## 2015-02-23 VITALS — BP 131/85 | HR 93 | Temp 98.4°F | Resp 18 | Ht 67.0 in | Wt 255.0 lb

## 2015-02-23 DIAGNOSIS — I639 Cerebral infarction, unspecified: Secondary | ICD-10-CM

## 2015-02-23 DIAGNOSIS — IMO0002 Reserved for concepts with insufficient information to code with codable children: Secondary | ICD-10-CM

## 2015-02-23 DIAGNOSIS — R7401 Elevation of levels of liver transaminase levels: Secondary | ICD-10-CM

## 2015-02-23 DIAGNOSIS — R740 Nonspecific elevation of levels of transaminase and lactic acid dehydrogenase [LDH]: Secondary | ICD-10-CM

## 2015-02-23 DIAGNOSIS — E1165 Type 2 diabetes mellitus with hyperglycemia: Secondary | ICD-10-CM

## 2015-02-23 DIAGNOSIS — B353 Tinea pedis: Secondary | ICD-10-CM

## 2015-02-23 DIAGNOSIS — Z114 Encounter for screening for human immunodeficiency virus [HIV]: Secondary | ICD-10-CM

## 2015-02-23 LAB — POCT GLYCOSYLATED HEMOGLOBIN (HGB A1C): Hemoglobin A1C: 8.3

## 2015-02-23 LAB — GLUCOSE, POCT (MANUAL RESULT ENTRY): POC Glucose: 108 mg/dl — AB (ref 70–99)

## 2015-02-23 MED ORDER — METFORMIN HCL 500 MG PO TABS: 500.0000 mg | ORAL_TABLET | Freq: Two times a day (BID) | ORAL | Status: DC

## 2015-02-23 MED ORDER — TERBINAFINE HCL 1 % EX CREA: 1.0000 "application " | TOPICAL_CREAM | Freq: Two times a day (BID) | CUTANEOUS | Status: DC

## 2015-02-23 NOTE — Progress Notes (Signed)
   Subjective:    Patient ID: Joselyn ArrowIndalesio Barcenes, male    DOB: 06/23/1976, 39 y.o.   MRN: [id number] CC: f/u CVA and DM2 HPI  1. CVA: CVA with L sided weakness and numbness. Now on ASA. Has quit smoking. Taking lipitor. Weakness and numbness improving. He reports that his is strength and sensation is  about 97%.  2. CHRONIC DIABETES  Disease Monitoring  Blood Sugar Ranges: not checking   Polyuria: no   Visual problems: no   Medication Compliance: yes  Medication Side Effects  Hypoglycemia: no   Preventitive Health Care  Eye Exam: due   Foot Exam: done today   Diet pattern: 3 meals per day  Exercise: minimal    Soc Hx: recent smoker, quitting with gum   Review of Systems  Constitutional: Negative for fever and chills.  Respiratory: Negative for shortness of breath.   Cardiovascular: Negative for chest pain.  Endocrine: Negative.   Neurological: Positive for weakness and numbness. Negative for dizziness, tremors, seizures, syncope, facial asymmetry, speech difficulty, light-headedness and headaches.       Objective:   Physical Exam BP 131/85 mmHg  Pulse 93  Temp(Src) 98.4 F (36.9 C) (Oral)  Resp 18  Ht 5\' 7"  (1.702 m)  Wt 255 lb (115.667 kg)  BMI 39.93 kg/m2  SpO2 96% General appearance: alert, cooperative and no distress Neck: no adenopathy, no carotid bruit, supple, symmetrical, trachea midline and thyroid not enlarged, symmetric, no tenderness/mass/nodules Lungs: clear to auscultation bilaterally Heart: regular rate and rhythm, S1, S2 normal, no murmur, click, rub or gallop Extremities: extremities normal, atraumatic, no cyanosis or edema, debris between toes  Lab Results  Component Value Date   HGBA1C 8.3 02/23/2015   CBG 108        Assessment & Plan:

## 2015-02-23 NOTE — Progress Notes (Signed)
Establish Care HFU -stroke Hx tobacco last tobacco use on February 06, 2015 No ETOH since February 06, 2015 No Drugs

## 2015-02-23 NOTE — Patient Instructions (Addendum)
Bobby Thompson.  Thank you for coming in today. It was a pleasure meeting you. I look forward to being your primary doctor.  1. Stroke: Risk factors: smoking, diabetes Treat risk factors: quit smoking well done, control diabetes    2. Diabetes:  Goal A1c is < 7 Increase metformin to 500 mg twice daily after meals. Diet Recommendations for Diabetes   Starchy (carb) foods include: Bread, rice, pasta, potatoes, corn, crackers, bagels, muffins, all baked goods.   Protein foods include: Meat, fish, poultry, eggs, dairy foods, and beans such as pinto and kidney beans (beans also provide carbohydrate).   1. Eat at least 3 meals and 1-2 snacks per day. Never go more than 4-5 hours while awake without eating.  2. Limit starchy foods to TWO per meal and ONE per snack. ONE portion of a starchy  food is equal to the following:   - ONE slice of bread (or its equivalent, such as half of a hamburger bun).   - 1/2 cup of a "scoopable" starchy food such as potatoes or rice.   - 1 OUNCE (28 grams) of starchy snack foods such as crackers or pretzels (look on label).   - 15 grams of carbohydrate as shown on food label.  3. Both lunch and dinner should include a protein food, a carb food, and vegetables.   - Obtain twice as many veg's as protein or carbohydrate foods for both lunch and dinner.   - Try to keep frozen veg's on hand for a quick vegetable serving.     - Fresh or frozen veg's are best.  4. Breakfast should always include protein.    3. Toenail fungus: lamisil twice daily  F/u in 3 months for diabetes   Dr. Armen PickupFunches

## 2015-02-24 LAB — COMPLETE METABOLIC PANEL WITH GFR
ALK PHOS: 95 U/L (ref 39–117)
ALT: 102 U/L — ABNORMAL HIGH (ref 0–53)
AST: 63 U/L — ABNORMAL HIGH (ref 0–37)
Albumin: 4.4 g/dL (ref 3.5–5.2)
BUN: 8 mg/dL (ref 6–23)
CO2: 26 mEq/L (ref 19–32)
CREATININE: 0.77 mg/dL (ref 0.50–1.35)
Calcium: 9.2 mg/dL (ref 8.4–10.5)
Chloride: 99 mEq/L (ref 96–112)
GFR, Est African American: >89 mL/min
GLUCOSE: 103 mg/dL — AB (ref 70–99)
Potassium: 4.1 mEq/L (ref 3.5–5.3)
SODIUM: 138 meq/L (ref 135–145)
Total Bilirubin: 0.7 mg/dL (ref 0.2–1.2)
Total Protein: 7.5 g/dL (ref 6.0–8.3)

## 2015-02-24 LAB — MICROALBUMIN / CREATININE URINE RATIO
Creatinine, Urine: 264.9 mg/dL
Microalb Creat Ratio: 4.9 mg/g (ref 0.0–30.0)
Microalb, Ur: 1.3 mg/dL (ref <2.0)

## 2015-02-24 LAB — HIV ANTIBODY (ROUTINE TESTING W REFLEX): HIV: NONREACTIVE

## 2015-02-24 NOTE — Assessment & Plan Note (Signed)
Screening HIV test

## 2015-02-24 NOTE — Assessment & Plan Note (Signed)
Stroke: Risk factors: smoking, diabetes Treat risk factors: quit smoking well done, control diabetes

## 2015-02-24 NOTE — Assessment & Plan Note (Signed)
Foot fungus:  lamisil twice daily

## 2015-02-24 NOTE — Assessment & Plan Note (Addendum)
A:  Diabetes:  Goal A1c is < 7  P:  Increase metformin to 500 mg twice daily after meals.

## 2015-03-02 ENCOUNTER — Telehealth: Payer: Self-pay | Admitting: *Deleted

## 2015-03-02 NOTE — Telephone Encounter (Signed)
Pt aware of results (information given in Spanish)

## 2015-03-02 NOTE — Telephone Encounter (Signed)
-----   Message from Dessa PhiJosalyn Funches, MD sent at 02/24/2015  9:01 AM EDT ----- AST/ALT still elevated but trending down. Screening HIV negative Normal urine microalbumin

## 2015-03-21 ENCOUNTER — Encounter: Payer: Self-pay | Attending: Family Medicine | Admitting: *Deleted

## 2015-03-21 ENCOUNTER — Encounter: Payer: Self-pay | Admitting: *Deleted

## 2015-03-21 VITALS — Ht 67.0 in | Wt 250.8 lb

## 2015-03-21 DIAGNOSIS — E1165 Type 2 diabetes mellitus with hyperglycemia: Secondary | ICD-10-CM | POA: Insufficient documentation

## 2015-03-21 DIAGNOSIS — IMO0002 Reserved for concepts with insufficient information to code with codable children: Secondary | ICD-10-CM

## 2015-03-21 DIAGNOSIS — Z713 Dietary counseling and surveillance: Secondary | ICD-10-CM | POA: Insufficient documentation

## 2015-03-21 NOTE — Progress Notes (Signed)
Diabetes Self-Management Education  Visit Type:  Initial DSME  Appt. Start Time: 1445 Appt. End Time: 1615  03/21/2015  [name], identified by name and date of birth, is a 39 y.o. male with a diagnosis of Diabetes: Type 2.  Other people present during visit:  Patient, Interpreter . Bobby Thompson was diagnosed with T2DM on 02/06/15 when he was admitted to the hospital with a TIA. That was also the last day he had a cigarette. He continues to have some weakness in his left arm. He has made significant dietary modifications since his diagnosis. He has discontinued regular soda, egg yokes.  ASSESSMENT  Height  (1.702 m), weight 250 lb 12.8 oz (113.762 kg). Body mass index is 39.27 kg/(m^2).  Initial Visit Information:  Are you currently following a meal plan?: No Are you taking your medications as prescribed?: Yes Are you checking your feet?: No How often do you need to have someone help you when you read instructions, pamphlets, or other written materials from your doctor or pharmacy?: 2 - Rarely What is the last grade level you completed in school?: 4th grade  Psychosocial:   Patient Belief/Attitude about Diabetes: Motivated to manage diabetes Self-care barriers: English as a second language Self-management support: Doctor's office, Family, CDE visits Other persons present: Patient, Interpreter Patient Concerns: Nutrition/Meal planning, Healthy Lifestyle Special Needs: None Preferred Learning Style: No preference indicated Learning Readiness: Ready  Complications:   Last HgB A1C per patient/outside source: 8.3 mg/dL How often do you check your blood sugar?: Not recommended by provider Have you had a dilated eye exam in the past 12 months?: No Have you had a dental exam in the past 12 months?: Yes  Diet Intake:  Breakfast: wheat bread, sugar free jelly, cup of coffee splenda (bagell, flat bread) / egg white sandwich Lunch: chicken breast, vegetables,  almonds Snack (afternoon): granola bar Dinner: chicken, vegetables / 2 chicken casadilla (lettuce, cheese) Snack (evening): 2C cheerios, milk 1% Beverage(s): flavored water, beer, decaf coffee, Lo Carb [employer] Drink  Exercise:  Exercise: ADL's  Individualized Plan for Diabetes Self-Management Training:   Learning Objective:  Patient will have a greater understanding of diabetes self-management. Patient education plan per assessed needs and concerns is to attend individual sessions      Education Topics Reviewed with Patient Today:  Definition of diabetes, type 1 and 2, and the diagnosis of diabetes Role of exercise on diabetes management, blood pressure control and cardiac health. Reviewed patients medication for diabetes, action, purpose, timing of dose and side effects. Relationship between chronic complications and blood glucose control, Assessed and discussed foot care and prevention of foot problems, Dental care   PATIENTS GOALS/Plan (Developed by the patient):  Nutrition: General guidelines for healthy choices and portions discussed Physical Activity: Exercise 3-5 times per week, 45 minutes per day Medications: take my medication as prescribed Reducing Risk: do foot checks daily  Patient Instructions  .Plan:  Aim for 3-4 Carb Choices per meal (45-60 grams) +/- 1 either way  Aim for 0-15 Carbs per snack if hungry  Include protein in moderation with your meals and snacks Consider reading food labels for Total Carbohydrate and Fat Grams of foods Consider  increasing your activity level by walking for 50 minutes 3 times per week as tolerated Consider taking medication as directed by MD  Honey nut Cheerios only 2/3C Only eat 3 tortillas at a meal Snacks: [employer] protein [employer] of fruit and nuts or low fat cheese (baby Alvester Morin  Light cheese - Low fat string cheese) Crackers and cheese Dannon Light & Fit Greek Yogurt / Yoplait 100 Greek Yogurt 4-5 eggs per week  is OK No more than 6 pack of beer per week OK to have beef and pork OK to have a total of 5 real eggs per week  You  Are doing a wonderful job!! Keep up the good work!!  Expected Outcomes:  Demonstrated interest in learning. Expect positive outcomes  Education material provided: Living Well with Diabetes, A1C conversion sheet, Meal plan card, My Plate and Snack sheet (spanish version)  If problems or questions, patient to contact team via:  Phone  Future DSME appointment: PRN

## 2015-03-21 NOTE — Patient Instructions (Signed)
.  Plan:  Aim for 3-4 Carb Choices per meal (45-60 grams) +/- 1 either way  Aim for 0-15 Carbs per snack if hungry  Include protein in moderation with your meals and snacks Consider reading food labels for Total Carbohydrate and Fat Grams of foods Consider  increasing your activity level by walking for 50 minutes 3 times per week as tolerated Consider taking medication as directed by MD  Honey nut Cheerios only 2/3C Only eat 3 tortillas at a meal Snacks: [employer] protein [employer] CorporationBars Piece of fruit and nuts or low fat cheese (baby Bell Light cheese - Low fat string cheese) Crackers and cheese Dannon Light & Fit Greek Yogurt / Yoplait 100 Greek Yogurt 4-5 eggs per week is OK No more than 6 pack of beer per week OK to have beef and pork OK to have a total of 5 real eggs per week  You  Are doing a wonderful job!! Keep up the good work!!

## 2015-03-27 ENCOUNTER — Ambulatory Visit: Payer: Self-pay

## 2015-04-05 ENCOUNTER — Other Ambulatory Visit: Payer: Self-pay | Admitting: Family Medicine

## 2015-04-14 ENCOUNTER — Telehealth: Payer: Self-pay | Admitting: Family Medicine

## 2015-04-14 MED ORDER — ATORVASTATIN CALCIUM 20 MG PO TABS: 20.0000 mg | ORAL_TABLET | Freq: Every day | ORAL | Status: DC

## 2015-04-14 NOTE — Telephone Encounter (Signed)
Pt is calling to check on the status of a refill sent on 5/18. Please follow up with pt.   atorvastatin (LIPITOR) 20 MG tablet [Pharmacy Med Name: ATORVASTATIN 20 MG TABLET 20 MG TAB]

## 2015-04-14 NOTE — Telephone Encounter (Signed)
[facility] Pt aware

## 2015-04-26 ENCOUNTER — Ambulatory Visit: Payer: MEDICAID | Admitting: Neurology

## 2015-04-26 ENCOUNTER — Telehealth: Payer: Self-pay | Admitting: Family Medicine

## 2015-04-26 NOTE — Telephone Encounter (Signed)
Please call back to patient PCP f/u to discuss night sweats.  Do not drink ensure at it is high is carbs.  glucerna is a better option for a meal supplement or to help appetite if it is low.

## 2015-04-26 NOTE — Telephone Encounter (Signed)
Patient's wife called to speak to nurse about being able to drink Ensure, patient also states that he is having nigh sweats. Please f/u with pt.

## 2015-05-03 ENCOUNTER — Other Ambulatory Visit: Payer: Self-pay | Admitting: Family Medicine

## 2015-05-05 ENCOUNTER — Other Ambulatory Visit: Payer: Self-pay | Admitting: Family Medicine

## 2015-05-05 DIAGNOSIS — IMO0002 Reserved for concepts with insufficient information to code with codable children: Secondary | ICD-10-CM

## 2015-05-05 DIAGNOSIS — E1165 Type 2 diabetes mellitus with hyperglycemia: Secondary | ICD-10-CM

## 2015-05-05 MED ORDER — METFORMIN HCL 500 MG PO TABS: 500.0000 mg | ORAL_TABLET | Freq: Two times a day (BID) | ORAL | Status: DC

## 2015-05-05 NOTE — Telephone Encounter (Signed)
Pt requesting refill on Metformin, pt has two days of meds left.  Please send to [facility] pharmacy.

## 2015-05-05 NOTE — Telephone Encounter (Signed)
Metformin refilled

## 2015-05-19 ENCOUNTER — Encounter: Payer: Self-pay | Admitting: Family Medicine

## 2015-05-19 ENCOUNTER — Ambulatory Visit: Payer: Medicaid Other | Attending: Family Medicine | Admitting: Family Medicine

## 2015-05-19 VITALS — BP 135/82 | HR 80 | Temp 98.1°F | Resp 16 | Ht 67.0 in | Wt 245.0 lb

## 2015-05-19 DIAGNOSIS — Z8673 Personal history of transient ischemic attack (TIA), and cerebral infarction without residual deficits: Secondary | ICD-10-CM | POA: Insufficient documentation

## 2015-05-19 DIAGNOSIS — IMO0002 Reserved for concepts with insufficient information to code with codable children: Secondary | ICD-10-CM

## 2015-05-19 DIAGNOSIS — Z23 Encounter for immunization: Secondary | ICD-10-CM

## 2015-05-19 DIAGNOSIS — E1165 Type 2 diabetes mellitus with hyperglycemia: Secondary | ICD-10-CM

## 2015-05-19 DIAGNOSIS — Z87891 Personal history of nicotine dependence: Secondary | ICD-10-CM | POA: Insufficient documentation

## 2015-05-19 DIAGNOSIS — I639 Cerebral infarction, unspecified: Secondary | ICD-10-CM

## 2015-05-19 DIAGNOSIS — E785 Hyperlipidemia, unspecified: Secondary | ICD-10-CM | POA: Insufficient documentation

## 2015-05-19 DIAGNOSIS — E119 Type 2 diabetes mellitus without complications: Secondary | ICD-10-CM

## 2015-05-19 LAB — GLUCOSE, POCT (MANUAL RESULT ENTRY): POC Glucose: 95 mg/dl (ref 70–99)

## 2015-05-19 LAB — POCT GLYCOSYLATED HEMOGLOBIN (HGB A1C): Hemoglobin A1C: 5.9

## 2015-05-19 MED ORDER — GLUCOSE BLOOD VI STRP: 1.0000 | ORAL_STRIP | Freq: Three times a day (TID) | Status: DC

## 2015-05-19 MED ORDER — METFORMIN HCL 500 MG PO TABS: 500.0000 mg | ORAL_TABLET | Freq: Every day | ORAL | Status: DC

## 2015-05-19 MED ORDER — ATORVASTATIN CALCIUM 20 MG PO TABS: 20.0000 mg | ORAL_TABLET | Freq: Every day | ORAL | Status: DC

## 2015-05-19 MED ORDER — TRUEPLUS LANCETS 28G MISC: 1.0000 | Freq: Three times a day (TID) | Status: DC

## 2015-05-19 MED ORDER — TRUE METRIX METER W/DEVICE KIT: 1.0000 | PACK | Status: DC | PRN

## 2015-05-19 NOTE — Assessment & Plan Note (Signed)
Recent stroke: Continue aspirin and lipitor to prevent a second stroke

## 2015-05-19 NOTE — Assessment & Plan Note (Signed)
Diabetes well controlled Metformin once daily Check sugars a 3-4 times per week   Diabetes blood sugar goals  Fasting (in AM before breakfast, 8 hrs of no eating or drinking (except water or unsweetened coffee or tea): 90-110 2 hrs after meals: < 160,   No low sugars: nothing < 70

## 2015-05-19 NOTE — Patient Instructions (Signed)
Bobby Thompson,  Thank you for coming in today  1. Diabetes well controlled Metformin once daily Check sugars a 3-4 times per week   Diabetes blood sugar goals  Fasting (in AM before breakfast, 8 hrs of no eating or drinking (except water or unsweetened coffee or tea): 90-110 2 hrs after meals: < 160,   No low sugars: nothing < 70   2. Recent stroke: Continue aspirin and lipitor to prevent a second stroke   F/u in 3 months for diabetes  Dr. Armen PickupFunches

## 2015-05-19 NOTE — Progress Notes (Signed)
F/U DM Cholesterol  Stated had night sweats Do not have glucometer  No Hx tobacco- last cigarette March 20,2016

## 2015-05-19 NOTE — Progress Notes (Signed)
   Subjective:    Patient ID: Bobby Thompson, male    DOB: 06/09/1976, 39 y.o.   MRN: [id number] CC: f/u DM and HLD  HPI  1. CHRONIC DIABETES  Disease Monitoring  Blood Sugar Ranges: 90-140  Polyuria: no   Visual problems: no   Medication Compliance: yes  Medication Side Effects  Hypoglycemia: no   2. CVA: in setting of DM2 and smoking. Has quit smoking. Controlling sugars. Exercising and losing weight.   Soc Hx: former smoker quit with nicotine gum   Review of Systems  Constitutional: Negative for fever, chills, fatigue and unexpected weight change.  Eyes: Negative for visual disturbance.  Respiratory: Negative for cough and shortness of breath.   Cardiovascular: Negative for chest pain, palpitations and leg swelling.  Gastrointestinal: Negative for nausea, vomiting, abdominal pain, diarrhea, constipation and blood in stool.  Musculoskeletal: Negative for myalgias, back pain, arthralgias, gait problem and neck pain.  Skin: Negative for rash.       Objective:   Physical Exam BP 135/82 mmHg  Pulse 80  Temp(Src) 98.1 F (36.7 C) (Oral)  Resp 16  Ht 5\' 7"  (1.702 m)  Wt 245 lb (111.131 kg)  BMI 38.36 kg/m2  SpO2 99%  Wt Readings from Last 3 Encounters:  05/19/15 245 lb (111.131 kg)  03/21/15 250 lb 12.8 oz (113.762 kg)  02/23/15 255 lb (115.667 kg)  General appearance: alert, cooperative and no distress Lungs: clear to auscultation bilaterally Heart: regular rate and rhythm, S1, S2 normal, no murmur, click, rub or gallop Extremities: extremities normal, atraumatic, no cyanosis or edema   Lab Results  Component Value Date   HGBA1C 5.90 05/19/2015   CBG 95      Assessment & Plan:

## 2015-06-08 ENCOUNTER — Ambulatory Visit (INDEPENDENT_AMBULATORY_CARE_PROVIDER_SITE_OTHER): Payer: Self-pay | Admitting: Neurology

## 2015-06-08 ENCOUNTER — Encounter: Payer: Self-pay | Admitting: Neurology

## 2015-06-08 VITALS — BP 133/85 | HR 81 | Ht 67.0 in | Wt 249.4 lb

## 2015-06-08 DIAGNOSIS — I671 Cerebral aneurysm, nonruptured: Secondary | ICD-10-CM

## 2015-06-08 NOTE — Progress Notes (Signed)
[facility] [address] [place]. [place] [address] [phone]       OFFICE FOLLOW-UP NOTE  [name] Date of Birth:  [birth year] Medical Record Number:  [id number]   HPI: 81 year hispanic male seen today for the first office follow-up visit following hospital admission for stroke in March 2016. Is a complaint by his wife as well as the Spanish language interpreter.On 02/05/2015 (time unknown) he had new onset of decreased sensation in his left leg, arm and face. He did not seek medical attention until   02/05/2105 when he noted the symptoms did not dissipate. He states he has had similar symptoms in the past but never sought medical attention. He does not take ASA daily. Patient was not administered TPA secondary to delay in arrival. He was admitted for further evaluation and treatment. MRI scan of the brain showed an acute right paracentral pontine infarct adjacent to old central pontine lacunar infarct . MRA of the brain showed mild irregularity of the basilar artery but no high-grade stenosis. Incidental 8 mm basilar tip aneurysm was noted. Transthoracic echo showed normal ejection fraction without cardiac source of embolism. Hemoglobin A1c was elevated at 8.2. Total cholesterol was 182, LDL 80 and HDL 25 mg percent. Patient was started on aspirin for stroke prevention and medications for blood pressure and cholesterol and seems to him quite well. He states his back to his prior baseline and is a had no complaints or deficits. His able to work without any  restrictions. He has quit smoking completely. He has no family history of brain aneurysms. He has started eating healthy and plans to lose weight and exercise regularly   ROS:   14 system review of systems is positive for no complaints  PMH:  Past Medical History  Diagnosis Date  . Diabetes mellitus without complication March 9381  . Hyperlipidemia March 2016    Social History:  History   Social  History  . Marital Status: Single    Spouse Name: N/A  . Number of Children: 2  . Years of Education: Elem   Occupational History  . maintenance    Social History Main Topics  . Smoking status: Former Research scientist (life sciences)  . Smokeless tobacco: Never Used     Comment: last cigarette 02/06/15  . Alcohol Use: 3.6 oz/week    6 Cans of beer per week  . Drug Use: No  . Sexual Activity:    Partners: Female   Other Topics Concern  . Not on file   Social History Narrative   Patient lives at home with family.   Caffeine Use:  rarely    Medications:   Current Outpatient Prescriptions on File Prior to Visit  Medication Sig Dispense Refill  . aspirin EC 81 MG EC tablet Take 1 tablet (81 mg total) by mouth daily. 30 tablet 1  . atorvastatin (LIPITOR) 20 MG tablet Take 1 tablet (20 mg total) by mouth daily at 6 PM. 30 tablet 11  . Blood Glucose Monitoring Suppl (TRUE METRIX METER) W/DEVICE KIT 1 each by Does not apply route as needed. 1 kit 0  . glucose blood (TRUE METRIX BLOOD GLUCOSE TEST) test strip 1 each by Other route 3 (three) times daily. 100 each 11  . metFORMIN (GLUCOPHAGE) 500 MG tablet Take 1 tablet (500 mg total) by mouth daily after supper. 30 tablet 5  . TRUEPLUS LANCETS 28G MISC 1 each by Does not apply route 3 (three) times daily. 100 each 11  No current facility-administered medications on file prior to visit.    Allergies:  No Known Allergies  Physical Exam General: well developed, well nourished, seated, in no evident distress Head: head normocephalic and atraumatic.  Neck: supple with no carotid or supraclavicular bruits Cardiovascular: regular rate and rhythm, no murmurs Musculoskeletal: no deformity Skin:  no rash/petichiae Vascular:  Normal pulses all extremities Filed Vitals:   06/08/15 0859  BP: 133/85  Pulse: 81   Neurologic Exam Mental Status: Awake and fully alert. Oriented to place and time. Recent and remote memory intact. Attention span, concentration and  fund of knowledge appropriate. Mood and affect appropriate.  Cranial Nerves: Fundoscopic exam reveals sharp disc margins. Pupils equal, briskly reactive to light. Extraocular movements full without nystagmus. Visual fields full to confrontation. Hearing intact. Facial sensation intact. Face, tongue, palate moves normally and symmetrically.  Motor: Normal bulk and tone. Normal strength in all tested extremity muscles. Sensory.: intact to touch ,pinprick .position and vibratory sensation.  Coordination: Rapid alternating movements normal in all extremities. Finger-to-nose and heel-to-shin performed accurately bilaterally. Gait and Station: Arises from chair without difficulty. Stance is normal. Gait demonstrates normal stride length and balance . Able to heel, toe and tandem walk without difficulty.  Reflexes: 1+ and symmetric. Toes downgoing.   NIHSS  0 Modified Rankin  0   ASSESSMENT: 38 year Hispanic male with right pontine infarct in March 2016 secondary to small vessel disease was doing clinically quite well. He also has incidental 8 mm basilar tip aneurysm which is asymptomatic. Vascular risk factors of obesity, hypertension, hyperlipidemia, mild obesity and smoking    PLAN: I had a long discussion with the patient and his wife via interpreter and personally reviewed imaging studies, stroke evaluation results with the patient and answered questions. Continue aspirin for second stroke prevention with strict control of lipids with LDL cholesterol goal below 70 mg percent, diabetes with hemoglobin A1c goal below 6.5% and hypertension with blood pressure goal below 130/90. I complimented him on having quit smoking. I also encouraged him to eat a healthy diet, exercise regularly and lose weight. I also discussed with him his incidental basilar tip aneurysm and the risk of rupture being 1-2% per year and discussed available treatment options including conservative follow-up versus endovascular  treatment. I will refer him to Dr. Estanislado Pandy for consultation for his basilar tip aneurysm. Return for follow-up in 6 months or call earlier if necessary. Antony Contras, MD  Note: This document was prepared with digital dictation and possible smart phrase technology. Any transcriptional errors that result from this process are unintentional

## 2015-06-08 NOTE — Patient Instructions (Signed)
I had a long discussion with the patient and his wife via interpreter and personally reviewed imaging studies, stroke evaluation results with the patient and answered questions. Continue aspirin for second stroke prevention with strict control of lipids with LDL cholesterol goal below 70 mg percent, diabetes with hemoglobin A1c goal below 6.5% and hypertension with blood pressure goal below 130/90. I complimented him on having quit smoking. I also encouraged him to eat a healthy diet, exercise regularly and lose weight. I also discussed with him his incidental basilar tip aneurysm and the risk of rupture being 1-2% per year and discussed available treatment options including conservative follow-up versus endovascular treatment. I will refer him to Dr. Corliss Skains for consultation for his basilar tip aneurysm. Return for follow-up in 6 months or call earlier if necessary.  Stroke Prevention Some medical conditions and behaviors are associated with an increased chance of having a stroke. You may prevent a stroke by making healthy choices and managing medical conditions. HOW CAN I REDUCE MY RISK OF HAVING A STROKE?   Stay physically active. Get at least 30 minutes of activity on most or all days.  Do not smoke. It may also be helpful to avoid exposure to secondhand smoke.  Limit alcohol use. Moderate alcohol use is considered to be:  No more than 2 drinks per day for men.  No more than 1 drink per day for nonpregnant women.  Eat healthy foods. This involves:  Eating 5 or more servings of fruits and vegetables a day.  Making dietary changes that address high blood pressure (hypertension), high cholesterol, diabetes, or obesity.  Manage your cholesterol levels.  Making food choices that are high in fiber and low in saturated fat, trans fat, and cholesterol may control cholesterol levels.  Take any prescribed medicines to control cholesterol as directed by your health care provider.  Manage your  diabetes.  Controlling your carbohydrate and sugar intake is recommended to manage diabetes.  Take any prescribed medicines to control diabetes as directed by your health care provider.  Control your hypertension.  Making food choices that are low in salt (sodium), saturated fat, trans fat, and cholesterol is recommended to manage hypertension.  Take any prescribed medicines to control hypertension as directed by your health care provider.  Maintain a healthy weight.  Reducing calorie intake and making food choices that are low in sodium, saturated fat, trans fat, and cholesterol are recommended to manage weight.  Stop drug abuse.  Avoid taking birth control pills.  Talk to your health care provider about the risks of taking birth control pills if you are over 45 years old, smoke, get migraines, or have ever had a blood clot.  Get evaluated for sleep disorders (sleep apnea).  Talk to your health care provider about getting a sleep evaluation if you snore a lot or have excessive sleepiness.  Take medicines only as directed by your health care provider.  For some people, aspirin or blood thinners (anticoagulants) are helpful in reducing the risk of forming abnormal blood clots that can lead to stroke. If you have the irregular heart rhythm of atrial fibrillation, you should be on a blood thinner unless there is a good reason you cannot take them.  Understand all your medicine instructions.  Make sure that other conditions (such as anemia or atherosclerosis) are addressed. SEEK IMMEDIATE MEDICAL CARE IF:   You have sudden weakness or numbness of the face, arm, or leg, especially on one side of the body.  Your face  or eyelid droops to one side.  You have sudden confusion.  You have trouble speaking (aphasia) or understanding.  You have sudden trouble seeing in one or both eyes.  You have sudden trouble walking.  You have dizziness.  You have a loss of balance or  coordination.  You have a sudden, severe headache with no known cause.  You have new chest pain or an irregular heartbeat. Any of these symptoms may represent a serious problem that is an emergency. Do not wait to see if the symptoms will go away. Get medical help at once. Call your local emergency services (911 in [place] Do not drive yourself to the hospital. Document Released: 12/12/2004 Document Revised: 03/21/2014 Document Reviewed: 05/07/2013 [facility] Patient Information 2015 [place], [place]. This information is not intended to replace advice given to you by your health care provider. Make sure you discuss any questions you have with your health care provider. Cerebral Aneurysm An aneurysm is the bulging or ballooning out of part of the weakened wall of a vein or artery. An aneurysm in the vein or artery of the brain is called a brain aneurysm, or cerebral aneurysm.  Aneurysms are a risk to your health because they may leak or rupture. Once the aneurysm leaks or ruptures, bleeding occurs. If the bleeding occurs within the brain tissue, the condition is called an intracerebral hemorrhage. An intracerebral hemorrhage can result in a hemorrhagic stroke. If the bleeding occurs in the area between the brain and the thin tissues that cover the brain, the condition is called a subarachnoid hemorrhage. This increases the pressure on the brain and causes some areas of the brain to not get the necessary blood flow. The blood from the ruptured aneurysm collects and presses on the surrounding brain tissue. A subarachnoid hemorrhage can cause a stroke. A ruptured cerebral aneurysm is a medical emergency. This can cause permanent damage and loss of brain function. CAUSES A cerebral aneurysm is caused when a weakened part of the blood vessel expands. The blood vessel expands due to the constant pressure from the flow of blood through the weakened blood vessel. Usually the aneurysm expands slowly. As the weakened  aneurysm expands, the walls of the aneurysm become weaker. Aneurysms may be associated with diseases that weaken and damage the walls of your blood vessels or blood vessels that develop abnormally. Some known causes for cerebral aneurysms are:  Head trauma.  Infection.  Use of "recreational drugs" such as cocaine or amphetamines. RISK FACTORS People at risk for a cerebral aneurysm or hemorrhagic stroke usually have one or more risk factors, which include:  Having high blood pressure (hypertension).  Abusing alcohol.  Having abnormal blood vessels present since birth.  Having certain bleeding disorders, such as hemophilia, sickle cell disease, or liver disease.  Taking blood thinners (anticoagulants).  Smoking. SIGNS AND SYMPTOMS  The signs and symptoms of an unruptured cerebral aneurysm will partly depend on its size and rate of growth. A small, unchanging aneurysm generally does not produce symptoms. A larger aneurysm that is steadily growing can increase pressure on the brain or nerves. That increased pressure from the unruptured cerebral aneurysm can cause:  A headache.  Problems with your vision.  Numbness or weakness in an arm or leg.  Problems with memory.  Problems speaking.  Seizures. If an aneurysm leaks or bursts, it can cause a stroke and be life-threatening. Symptoms may include:  A sudden, severe headache with no known cause. The headache is often described as the  worst headache ever experienced.  Nausea or vomiting, especially when combined with other symptoms such as a headache.  Sudden weakness or numbness of the face, arm, or leg, especially on one side of the body.  Sudden trouble walking or difficulty moving arms or legs.  Sudden confusion.  Sudden personality changes.  Trouble speaking (aphasia) or understanding.  Difficulty swallowing.  Sudden trouble seeing in one or both eyes.  Double vision.  Dizziness.  Loss of balance or  coordination.  Intolerance to light.  Stiff neck. DIAGNOSIS  A CTA (computed tomographic angiography) may be performed to diagnose an aneurysm. A CTA uses dye and a CT scanner to take images of your blood vessels. An MRA (magnetic resonance angiography) may be used to diagnose an aneurysm. An MRA is performed in an MRI machine. While in the MRI machine, images of your blood vessels are taken. A cerebral aneurysm may also be diagnosed with a cerebral angiogram. A cerebral angiogram requires a tube called a catheter to be inserted into a blood vessel and advanced to the blood vessels in your neck. Dye is then injected while X-ray images are taken to show the blood vessels in your brain. TREATMENT  Unruptured Aneurysms Treatment is complex when an aneurysm is found and it is not causing problems. Treatment is very individualized, as each case is different. Many things must be considered, such as the size and exact location of your aneurysm, your age, your overall health, and your feelings and preferences. Small aneurysms in certain locations of the brain have a very low chance of bleeding or rupturing. These small aneurysms may not be treated. However, depending on the size and location of the aneurysm, treatments may be recommended and include:  Coiling. During this procedure, a catheter is inserted and advanced through a blood vessel. Once the catheter reaches the aneurysm, tiny coils are used to block blood flow into the aneurysm.  Surgical clipping. During surgery, a clip is placed at the base of the aneurysm. The clip prevents blood from continuing to enter the aneurysm. Ruptured Aneurysms Immediate emergency surgery may be needed to help prevent damage to the brain and to reduce the risk of rebleeding. Timing of treatment is an important factor in the prevention of complications. Successful early treatment of a ruptured aneurysm (within the first 3 days of a bleed) helps to prevent rebleeding and  blood vessel spasm. In some cases, there may be a reason to treat later (10-14 days after a rupture). Many things are considered when making this decision, and each case is handled individually. HOME CARE INSTRUCTIONS  Take medicines only as instructed by your health care provider.  Eat healthy foods. It is recommended that you eat 5 or more servings of fruits and vegetables each day. Foods may need to be a special consistency (soft or pureed), or small bites may need to be taken if you have had a ruptured aneurysm or stroke. Certain dietary changes may be advised to address high blood pressure, high cholesterol, diabetes, or obesity.  Food choices that are low in salt (sodium), saturated fat, trans fat, and cholesterol are recommended to manage high blood pressure.  Food choices that are high in fiber and low in saturated fat, trans fat, and cholesterol are recommended to control cholesterol levels.  Controlling carbohydrate and sugar intake is recommended to manage diabetes.  Reducing calorie intake and making food choices that are low in sodium, saturated fat, trans fat, and cholesterol are recommended to manage obesity.  Maintain a healthy weight.  Stay physically active. It is recommended that you get at least 30 minutes of activity on most or all days.  Do not smoke.  Limit alcohol use. Moderate alcohol use is considered to be:  No more than 2 drinks each day for men.  No more than 1 drink each day for nonpregnant women.  Stop drug abuse.  A safe home environment is important to reduce the risk of falls. Your health care provider may arrange for specialists to evaluate your home. Having grab bars in the bedroom and bathroom is often important. Your health care provider may arrange for special equipment to be used at home, such as raised toilets and a seat for the shower.  Physical, occupational, and speech therapy. Ongoing therapy may be needed to maximize your recovery after a  ruptured aneurysm or stroke. If you have been advised to use a walker or a cane, use it at all times. Be sure to keep your therapy appointments.  Follow all instructions for follow-up with your health care provider. This is very important. This includes any referrals, physical therapy, rehabilitation, and laboratory tests. Proper follow-up may prevent an aneurysm rupture or a stroke. SEEK IMMEDIATE MEDICAL CARE IF:  You have a sudden, severe headache with no known cause.  You have sudden nausea or vomiting with a severe headache.  You have sudden weakness or numbness of the face, arm, or leg, especially on one side of the body.  You have sudden trouble walking or difficulty moving arms or legs.  You have sudden confusion.  You have trouble speaking or understanding.  You have sudden trouble seeing in one or both eyes.  You have a sudden loss of balance or coordination.  You have a stiff neck.  You have difficulty breathing.  You have a partial or total loss of consciousness. Any of these symptoms may represent a serious problem that is an emergency. Do not wait to see if the symptoms will go away. Get medical help at once. Call your local emergency services (911 in [place] Do not drive yourself to the hospital. Document Released: 07/27/2002 Document Revised: 03/21/2014 Document Reviewed: 04/22/2013 [facility] Patient Information 2015 [place], [place]. This information is not intended to replace advice given to you by your health care provider. Make sure you discuss any questions you have with your health care provider.

## 2015-06-13 ENCOUNTER — Telehealth (HOSPITAL_COMMUNITY): Payer: Self-pay | Admitting: Interventional Radiology

## 2015-06-13 NOTE — Telephone Encounter (Signed)
Called pt on both his cell phone and work phone. Left VM on cell and left message with Tresa Endo at his job for him to call back ASAP. JM

## 2015-06-15 ENCOUNTER — Other Ambulatory Visit (HOSPITAL_COMMUNITY): Payer: Self-pay | Admitting: Interventional Radiology

## 2015-06-15 ENCOUNTER — Ambulatory Visit (HOSPITAL_COMMUNITY)
Admission: RE | Admit: 2015-06-15 | Discharge: 2015-06-15 | Disposition: A | Discharge status: Home / Self Care | Payer: Self-pay | Source: Ambulatory Visit | Attending: Interventional Radiology | Admitting: Interventional Radiology

## 2015-06-15 DIAGNOSIS — I671 Cerebral aneurysm, nonruptured: Secondary | ICD-10-CM

## 2015-06-16 ENCOUNTER — Other Ambulatory Visit (HOSPITAL_COMMUNITY): Payer: Self-pay | Admitting: Interventional Radiology

## 2015-06-16 DIAGNOSIS — I671 Cerebral aneurysm, nonruptured: Secondary | ICD-10-CM

## 2015-06-16 DIAGNOSIS — G459 Transient cerebral ischemic attack, unspecified: Secondary | ICD-10-CM

## 2015-07-04 ENCOUNTER — Other Ambulatory Visit: Payer: Self-pay | Admitting: Radiology

## 2015-07-07 ENCOUNTER — Encounter (HOSPITAL_COMMUNITY)
Admission: RE | Admit: 2015-07-07 | Discharge: 2015-07-07 | Disposition: A | Discharge status: Home / Self Care | Payer: Self-pay | Source: Ambulatory Visit | Attending: Interventional Radiology | Admitting: Interventional Radiology

## 2015-07-07 ENCOUNTER — Encounter (HOSPITAL_COMMUNITY): Payer: Self-pay

## 2015-07-07 DIAGNOSIS — E119 Type 2 diabetes mellitus without complications: Secondary | ICD-10-CM | POA: Insufficient documentation

## 2015-07-07 DIAGNOSIS — I671 Cerebral aneurysm, nonruptured: Secondary | ICD-10-CM | POA: Insufficient documentation

## 2015-07-07 DIAGNOSIS — Z01812 Encounter for preprocedural laboratory examination: Secondary | ICD-10-CM | POA: Insufficient documentation

## 2015-07-07 DIAGNOSIS — Z01818 Encounter for other preprocedural examination: Secondary | ICD-10-CM | POA: Insufficient documentation

## 2015-07-07 DIAGNOSIS — Z79899 Other long term (current) drug therapy: Secondary | ICD-10-CM | POA: Insufficient documentation

## 2015-07-07 DIAGNOSIS — Z87891 Personal history of nicotine dependence: Secondary | ICD-10-CM | POA: Insufficient documentation

## 2015-07-07 DIAGNOSIS — Z8673 Personal history of transient ischemic attack (TIA), and cerebral infarction without residual deficits: Secondary | ICD-10-CM | POA: Insufficient documentation

## 2015-07-07 DIAGNOSIS — E785 Hyperlipidemia, unspecified: Secondary | ICD-10-CM | POA: Insufficient documentation

## 2015-07-07 DIAGNOSIS — Z7982 Long term (current) use of aspirin: Secondary | ICD-10-CM | POA: Insufficient documentation

## 2015-07-07 DIAGNOSIS — Z7902 Long term (current) use of antithrombotics/antiplatelets: Secondary | ICD-10-CM | POA: Insufficient documentation

## 2015-07-07 HISTORY — DX: Other specified postprocedural states: Z98.890

## 2015-07-07 HISTORY — DX: Other complications of anesthesia, initial encounter: T88.59XA

## 2015-07-07 HISTORY — DX: Adverse effect of unspecified anesthetic, initial encounter: T41.45XA

## 2015-07-07 HISTORY — DX: Other specified postprocedural states: R11.2

## 2015-07-07 HISTORY — DX: Anxiety disorder, unspecified: F41.9

## 2015-07-07 HISTORY — DX: Cerebral infarction, unspecified: I63.9

## 2015-07-07 LAB — COMPREHENSIVE METABOLIC PANEL
ALT: 29 U/L (ref 17–63)
ANION GAP: 11 (ref 5–15)
AST: 27 U/L (ref 15–41)
Albumin: 4.1 g/dL (ref 3.5–5.0)
Alkaline Phosphatase: 79 U/L (ref 38–126)
BUN: 9 mg/dL (ref 6–20)
CHLORIDE: 103 mmol/L (ref 101–111)
CO2: 24 mmol/L (ref 22–32)
Calcium: 9.2 mg/dL (ref 8.9–10.3)
Creatinine, Ser: 0.73 mg/dL (ref 0.61–1.24)
GFR calc Af Amer: >60 mL/min (ref >60)
Glucose, Bld: 107 mg/dL — ABNORMAL HIGH (ref 65–99)
POTASSIUM: 3.9 mmol/L (ref 3.5–5.1)
Sodium: 138 mmol/L (ref 135–145)
Total Bilirubin: 0.8 mg/dL (ref 0.3–1.2)
Total Protein: 7.1 g/dL (ref 6.5–8.1)

## 2015-07-07 LAB — APTT: aPTT: 29 seconds (ref 24–37)

## 2015-07-07 LAB — CBC WITH DIFFERENTIAL/PLATELET
Basophils Absolute: 0.1 10*3/uL (ref 0.0–0.1)
Basophils Relative: 1 % (ref 0–1)
EOS PCT: 4 % (ref 0–5)
Eosinophils Absolute: 0.3 10*3/uL (ref 0.0–0.7)
HCT: 44.9 % (ref 39.0–52.0)
Hemoglobin: 16 g/dL (ref 13.0–17.0)
LYMPHS ABS: 3.1 10*3/uL (ref 0.7–4.0)
LYMPHS PCT: 33 % (ref 12–46)
MCH: 31.4 pg (ref 26.0–34.0)
MCHC: 35.6 g/dL (ref 30.0–36.0)
MCV: 88 fL (ref 78.0–100.0)
MONO ABS: 0.6 10*3/uL (ref 0.1–1.0)
MONOS PCT: 6 % (ref 3–12)
Neutro Abs: 5.2 10*3/uL (ref 1.7–7.7)
Neutrophils Relative %: 56 % (ref 43–77)
PLATELETS: 249 10*3/uL (ref 150–400)
RBC: 5.1 MIL/uL (ref 4.22–5.81)
RDW: 12.3 % (ref 11.5–15.5)
WBC: 9.3 10*3/uL (ref 4.0–10.5)

## 2015-07-07 LAB — PROTIME-INR
INR: 1.04 (ref 0.00–1.49)
PROTHROMBIN TIME: 13.8 s (ref 11.6–15.2)

## 2015-07-07 LAB — GLUCOSE, CAPILLARY: Glucose-Capillary: 126 mg/dL — ABNORMAL HIGH (ref 65–99)

## 2015-07-07 NOTE — Pre-Procedure Instructions (Signed)
Bobby Thompson  07/07/2015      [id number] [address] [place] [address] Phone: [phone] Fax: [phone]  [facility] [place], [place] - [address] [place] [address] Phone: [phone] Fax: [phone]  [facility] - [address] [place]), [place] - [address] [place] [address] Phone: [phone] Fax: [phone]    Your procedure is scheduled on 07/13/2015  Report to [facility] Admitting at 9:30 A.M.  Call this number if you have problems the morning of surgery:  [phone]   Remember:  Do not eat food or drink liquids after midnight.  On Wednesday  Take these medicines the morning of surgery with A SIP OF WATER :  PLAVIX & ASPIRIN   Do not wear jewelry  Do not wear lotions, powders, or perfumes.  You may wear deodorant.  Men may shave face and neck.  Do not bring valuables to the hospital.  [facility] is not responsible for any belongings or valuables.  Contacts, dentures or bridgework may not be worn into surgery.  Leave your suitcase in the car.  After surgery it may be brought to your room.  For patients admitted to the hospital, discharge time will be determined by your treatment team.  Patients discharged the day of surgery will not be allowed to drive home.   Name and phone number of your driver:   /w family  Special instructions:  Special Instructions: Bobby Thompson - Preparing for Surgery  Before surgery, you can play an important role.  Because skin is not sterile, your skin needs to be as free of germs as possible.  You can reduce the number of germs on you skin by washing with CHG (chlorahexidine gluconate) soap before surgery.  CHG is an antiseptic cleaner which kills germs and bonds with the skin to continue killing germs even after washing.  Please DO NOT use if you have an allergy to  CHG or antibacterial soaps.  If your skin becomes reddened/irritated stop using the CHG and inform your nurse when you arrive at Short Stay.  Do not shave (including legs and underarms) for at least 48 hours prior to the first CHG shower.  You may shave your face.  Please follow these instructions carefully:   1.  Shower with CHG Soap the night before surgery and the  morning of Surgery.  2.  If you choose to wash your hair, wash your hair first as usual with your  normal shampoo.  3.  After you shampoo, rinse your hair and body thoroughly to remove the  Shampoo.  4.  Use CHG as you would any other liquid soap.  You can apply chg directly to the skin and wash gently with scrungie or a clean washcloth.  5.  Apply the CHG Soap to your body ONLY FROM THE NECK DOWN.    Do not use on open wounds or open sores.  Avoid contact with your eyes, ears, mouth and genitals (private parts).  Wash genitals (private parts)   with your normal soap.  6.  Wash thoroughly, paying special attention to the area where your surgery will be performed.  7.  Thoroughly rinse your body with warm water from the neck down.  8.  DO NOT shower/wash with your normal soap after using and rinsing off   the CHG Soap.  9.  Bobby Thompson  yourself dry with a clean towel.            10.  Wear clean pajamas.            11.  Place clean sheets on your bed the night of your first shower and do not sleep with pets.  Day of Surgery  Do not apply any lotions/deodorants the morning of surgery.  Please wear clean clothes to the hospital/surgery center.  Please read over the following fact sheets that you were given. Pain Booklet, Coughing and Deep Breathing and Surgical Site Infection Prevention

## 2015-07-07 NOTE — Progress Notes (Signed)
Pt. with language barrier (  Interpretor present) but he explains today that the PCP started him on Metformin, but since then they have told him his  HgbA1C is ok & he could stop, then he states he thought it was helping him with his weight so the PCP told him he could take it once per day.  Then he reports the dentist told him his gums were irritated because of the Metformin so he has decided as of yesterday to stop Metformin. HgbA1c being captured today.

## 2015-07-08 LAB — HEMOGLOBIN A1C
Hgb A1c MFr Bld: 6.3 % — ABNORMAL HIGH (ref 4.8–5.6)
MEAN PLASMA GLUCOSE: 134 mg/dL

## 2015-07-10 ENCOUNTER — Encounter (HOSPITAL_COMMUNITY): Payer: Self-pay

## 2015-07-10 NOTE — Progress Notes (Signed)
Anesthesia Chart Review: Patient is a 39 year old male scheduled for cerebral arteriogram with possible embolization of basilar apex aneurysm on 07/12/15 by Dr. Corliss Skains.  History includes former smoker (quit 02/04/15), post-operative N/V, DM2, HLD, anxiety, CVA 01/2015 (late presentation; no TPA). He drinks 6 beers 2-3X/week. Spanish speaking only (scheduler to arrange interpretor for day of surgery). PCP is Dr. Armen Pickup at [facility] (established 02/2015). Neurologist is Dr. Pearlean Brownie who referred patient to IR. BMI is 39.22, consistent with obesity-borderline morbid obesity.  Meds include ASA, Lipitor, Plavix, metformin.  02/06/15 EKG: ST at 108 bpm, cannot rule out anterior infarct (age undetermined). I think there is also finding of possible inferior infarct (age undetermined). No comparison EKG in Epic or Muse. Admission note from 02/06/15 reported an episode of chest pain X 6 hours not associated with SOB or diaphoresis one-two weeks prior to admission, but had denied chest pain with exertion and was able to climb stairs without difficulty. 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 Troponin was 0.00 and echo showed normal EF with normal wall motion. Previous episode of chest pain was 2-3 years prior when he experienced stroke like symptoms with body weakness (MRI showed evidence of old lacunar infarct). PAT RN reports patient denied any new chest pain.  02/07/15 Echo: Study Conclusions - Left ventricle: The cavity size was normal. Wall thickness was increased in a pattern of mild LVH. Systolic function was normal. The estimated ejection fraction was in the range of 55% to 60%. Wall motion was normal; there were no regional wall motion abnormalities. Left ventricular diastolic function parameters were normal. Impressions: - No LV thrombus seen with Definity.  02/08/15 Carotid duplex: Summary: - The vertebral arteries appear patent with antegrade  flow. - Findings consistent with 1- 39 percent stenosis involving the right internal carotid artery and the left internal carotid artery.  02/06/15 MRI/MRA brain/head: IMPRESSION: 1. Acute on chronic brainstem ischemia; Acute right paracentral pons lacunar infarct. No associated mass effect or hemorrhage. 2. Basilar tip aneurysm, 8 mm. Mild basilar artery irregularity elsewhere, no posterior circulation stenosis or major circle of Willis branch occlusion identified.  Preoperative labs noted. A1C 6.3. LFTS have normalized. He is for p2y12 on arrival.  I reviewed above with anesthesiologist Dr. Noreene Larsson. Patient denied chest pain at PAT. His recent echo showed normal EF without wall motion abnormality. If no new CV symptoms, it is anticipated that he can proceed as planned.  Velna Ochs [facility]/Anesthesiology Phone [phone] 07/10/2015 2:42 PM

## 2015-07-12 ENCOUNTER — Ambulatory Visit (HOSPITAL_COMMUNITY): Admission: RE | Admit: 2015-07-12 | Payer: Self-pay | Source: Ambulatory Visit

## 2015-07-12 ENCOUNTER — Other Ambulatory Visit: Payer: Self-pay | Admitting: Radiology

## 2015-07-13 ENCOUNTER — Encounter (HOSPITAL_COMMUNITY): Payer: Self-pay | Admitting: Anesthesiology

## 2015-07-13 ENCOUNTER — Ambulatory Visit (HOSPITAL_COMMUNITY): Payer: Self-pay | Admitting: Anesthesiology

## 2015-07-13 ENCOUNTER — Ambulatory Visit (HOSPITAL_COMMUNITY)
Admission: RE | Admit: 2015-07-13 | Discharge: 2015-07-13 | Disposition: A | Discharge status: Home / Self Care | Payer: Self-pay | Source: Ambulatory Visit | Attending: Interventional Radiology | Admitting: Interventional Radiology

## 2015-07-13 ENCOUNTER — Encounter (HOSPITAL_COMMUNITY)
Admission: RE | Disposition: A | Discharge status: Home / Self Care | Payer: Self-pay | Source: Ambulatory Visit | Attending: Interventional Radiology

## 2015-07-13 ENCOUNTER — Encounter (HOSPITAL_COMMUNITY): Payer: Self-pay

## 2015-07-13 DIAGNOSIS — Z87891 Personal history of nicotine dependence: Secondary | ICD-10-CM | POA: Insufficient documentation

## 2015-07-13 DIAGNOSIS — F419 Anxiety disorder, unspecified: Secondary | ICD-10-CM | POA: Insufficient documentation

## 2015-07-13 DIAGNOSIS — Z8673 Personal history of transient ischemic attack (TIA), and cerebral infarction without residual deficits: Secondary | ICD-10-CM | POA: Insufficient documentation

## 2015-07-13 DIAGNOSIS — I671 Cerebral aneurysm, nonruptured: Secondary | ICD-10-CM | POA: Insufficient documentation

## 2015-07-13 DIAGNOSIS — E785 Hyperlipidemia, unspecified: Secondary | ICD-10-CM | POA: Insufficient documentation

## 2015-07-13 DIAGNOSIS — Z7982 Long term (current) use of aspirin: Secondary | ICD-10-CM | POA: Insufficient documentation

## 2015-07-13 DIAGNOSIS — G459 Transient cerebral ischemic attack, unspecified: Secondary | ICD-10-CM

## 2015-07-13 DIAGNOSIS — E119 Type 2 diabetes mellitus without complications: Secondary | ICD-10-CM | POA: Insufficient documentation

## 2015-07-13 DIAGNOSIS — Z7902 Long term (current) use of antithrombotics/antiplatelets: Secondary | ICD-10-CM | POA: Insufficient documentation

## 2015-07-13 HISTORY — PX: RADIOLOGY WITH ANESTHESIA: SHX6223

## 2015-07-13 LAB — PLATELET INHIBITION P2Y12: PLATELET FUNCTION P2Y12: 130 [PRU] — AB (ref 194–418)

## 2015-07-13 LAB — GLUCOSE, CAPILLARY
GLUCOSE-CAPILLARY: 110 mg/dL — AB (ref 65–99)
Glucose-Capillary: 109 mg/dL — ABNORMAL HIGH (ref 65–99)

## 2015-07-13 SURGERY — RADIOLOGY WITH ANESTHESIA
Anesthesia: General

## 2015-07-13 MED ORDER — FENTANYL CITRATE (PF) 250 MCG/5ML IJ SOLN
INTRAMUSCULAR | Status: AC
  Filled 2015-07-13: qty 5

## 2015-07-13 MED ORDER — IOHEXOL 300 MG/ML  SOLN
200.0000 mg | Freq: Once | INTRAMUSCULAR | Status: DC | PRN
  Administered 2015-07-13: 100 mL via INTRAVENOUS
  Filled 2015-07-13: qty 10

## 2015-07-13 MED ORDER — CEFAZOLIN SODIUM-DEXTROSE 2-3 GM-% IV SOLR
INTRAVENOUS | Status: AC
  Filled 2015-07-13: qty 50

## 2015-07-13 MED ORDER — HEPARIN SODIUM (PORCINE) 1000 UNIT/ML IJ SOLN
INTRAMUSCULAR | Status: DC | PRN
  Administered 2015-07-13: 1 mL via INTRAVENOUS

## 2015-07-13 MED ORDER — NITROGLYCERIN 1 MG/10 ML FOR IR/CATH LAB
INTRA_ARTERIAL | Status: AC
  Filled 2015-07-13: qty 10

## 2015-07-13 MED ORDER — SODIUM CHLORIDE 0.9 % IV SOLN
Freq: Once | INTRAVENOUS | Status: DC
Start: 2015-07-13 — End: 2015-07-13

## 2015-07-13 MED ORDER — MIDAZOLAM HCL 2 MG/2ML IJ SOLN
INTRAMUSCULAR | Status: AC
  Filled 2015-07-13: qty 4

## 2015-07-13 MED ORDER — FENTANYL CITRATE (PF) 250 MCG/5ML IJ SOLN
INTRAMUSCULAR | Status: AC
Start: 2015-07-13 — End: 2015-07-13
  Filled 2015-07-13: qty 5

## 2015-07-13 MED ORDER — LIDOCAINE HCL 1 % IJ SOLN
INTRAMUSCULAR | Status: AC
  Filled 2015-07-13: qty 20

## 2015-07-13 MED ORDER — HYDROMORPHONE HCL 1 MG/ML IJ SOLN: 0.2500 mg | INTRAMUSCULAR | Status: DC | PRN

## 2015-07-13 MED ORDER — ONDANSETRON HCL 4 MG/2ML IJ SOLN
INTRAMUSCULAR | Status: DC | PRN
  Administered 2015-07-13: 4 mg via INTRAVENOUS

## 2015-07-13 MED ORDER — SODIUM CHLORIDE 0.9 % IV SOLN: INTRAVENOUS | Status: AC

## 2015-07-13 MED ORDER — NIMODIPINE 30 MG PO CAPS: 0.0000 mg | ORAL_CAPSULE | ORAL | Status: DC

## 2015-07-13 MED ORDER — ASPIRIN EC 325 MG PO TBEC
325.0000 mg | DELAYED_RELEASE_TABLET | Freq: Once | ORAL | Status: AC
  Administered 2015-07-13: 325 mg via ORAL
  Filled 2015-07-13: qty 1

## 2015-07-13 MED ORDER — MIDAZOLAM HCL 5 MG/5ML IJ SOLN
INTRAMUSCULAR | Status: DC | PRN
  Administered 2015-07-13 (×4): 1 mg via INTRAVENOUS

## 2015-07-13 MED ORDER — LACTATED RINGERS IV SOLN
INTRAVENOUS | Status: DC
  Administered 2015-07-13 (×2): via INTRAVENOUS

## 2015-07-13 MED ORDER — CEFAZOLIN SODIUM-DEXTROSE 2-3 GM-% IV SOLR
2.0000 g | Freq: Once | INTRAVENOUS | Status: AC
  Administered 2015-07-13: 2 g via INTRAVENOUS

## 2015-07-13 MED ORDER — FENTANYL CITRATE (PF) 100 MCG/2ML IJ SOLN
INTRAMUSCULAR | Status: DC | PRN
  Administered 2015-07-13: 25 ug via INTRAVENOUS
  Administered 2015-07-13: 50 ug via INTRAVENOUS
  Administered 2015-07-13 (×2): 25 ug via INTRAVENOUS

## 2015-07-13 NOTE — Procedures (Signed)
S/P 4 vessel cerebral arteriogram. RT CFa approach. 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

## 2015-07-13 NOTE — H&P (Signed)
Chief Complaint: Patient was seen in consultation today for basilar artery aneurysm at the request of Dr Leonie Man  Referring Physician(s): Dr Leonie Man  History of Present Illness: Bobby Thompson is a 39 y.o. 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 Diabetes mellitus without complication March 6301  . Hyperlipidemia March 2016  . Complication of anesthesia   . PONV (postoperative nausea and vomiting)   . Stroke   . Anxiety     afraid of heights, different fears have surfaced since stroke     Past Surgical History  Procedure Laterality Date  . Pilonidal cyst excision      Allergies: Review of patient's allergies indicates no known allergies.  Medications: Prior to Admission medications   Medication Sig Start Date End Date Taking? Authorizing Provider  aspirin EC 81 MG EC tablet Take 1 tablet (81 mg total) by mouth daily. 02/08/15   Ejiroghene Arlyce Dice, MD  atorvastatin (LIPITOR) 20 MG tablet Take 1 tablet (20 mg total) by mouth daily at 6 PM. 05/19/15   Boykin Nearing, MD  Blood Glucose Monitoring Suppl (TRUE METRIX METER) W/DEVICE KIT 1 each by Does not apply route as needed. 05/19/15   Josalyn Funches, MD  clopidogrel (PLAVIX) 75 MG tablet Take 75 mg by mouth daily before supper.    Historical Provider, MD  glucose blood (TRUE METRIX BLOOD GLUCOSE TEST) test strip 1 each by Other route 3 (three) times daily. 05/19/15   Josalyn Funches, MD  metFORMIN (GLUCOPHAGE) 500 MG tablet Take 1 tablet (500 mg total) by mouth daily after supper. Patient taking differently: Take 500 mg by mouth daily with breakfast.  05/19/15   Boykin Nearing, MD  nicotine polacrilex (NICORETTE) 4 MG  gum Take 4 mg by mouth 3 (three) times daily as needed for smoking cessation.    Historical Provider, MD  TRUEPLUS LANCETS 28G MISC 1 each by Does not apply route 3 (three) times daily. 05/19/15   Boykin Nearing, MD     Family History  Problem Relation Age of Onset  . Diabetes Father     Social History   Social History  . Marital Status: Single    Spouse Name: N/A  . Number of Children: 2  . Years of Education: Elem   Occupational History  . maintenance    Social History Main Topics  . Smoking status: Former Smoker    Quit date: 02/04/2015  . Smokeless tobacco: Never Used     Comment: last cigarette 02/06/15  . Alcohol Use: 3.6 oz/week    6 Cans of beer per week     Comment: 2-3 times per week a couple of beers   . Drug Use: No  . Sexual Activity:    Partners: Female   Other Topics Concern  . None   Social History Narrative   Patient lives at home with family.   Caffeine Use:  rarely    Review of Systems: A 12 point ROS discussed and pertinent positives are indicated in the HPI above.  All other systems are negative.  Review of Systems  Constitutional: Negative for fever, activity change, appetite change, fatigue and unexpected weight change.  HENT: Negative for ear pain, hearing loss and tinnitus.  Eyes: Negative for photophobia and visual disturbance.  Respiratory: Negative for cough, choking and shortness of breath.   Cardiovascular: Negative for chest pain.  Gastrointestinal: Negative for nausea, vomiting and abdominal pain.  Genitourinary: Negative for difficulty urinating.  Musculoskeletal: Negative for back pain.  Neurological: Negative for dizziness, tremors, seizures, syncope, facial asymmetry, speech difficulty, weakness, light-headedness, numbness and headaches.  Psychiatric/Behavioral: Negative for behavioral problems and confusion.    Vital Signs: There were no vitals taken for this visit.  Physical Exam  Constitutional: He is oriented to person,  place, and time. He appears well-nourished.  HENT:  Head: Atraumatic.  Eyes: EOM are normal.  Neck: Normal range of motion. Neck supple.  Cardiovascular: Normal rate, regular rhythm and normal heart sounds.   No murmur heard. Pulmonary/Chest: Effort normal and breath sounds normal. He has no wheezes.  Abdominal: Soft. Bowel sounds are normal. There is no tenderness.  Musculoskeletal: Normal range of motion.  Neurological: He is alert and oriented to person, place, and time. No cranial nerve deficit. Coordination normal.  Skin: Skin is warm and dry.  Psychiatric: He has a normal mood and affect. His behavior is normal. Judgment and thought content normal.  Nursing note and vitals reviewed.   Mallampati Score:  MD Evaluation Airway: WNL Heart: WNL Abdomen: WNL Chest/ Lungs: WNL ASA  Classification: 2 Mallampati/Airway Score: Two  Imaging: Ir Radiologist Eval & Mgmt  06/16/2015   EXAM: NEW PATIENT OFFICE VISIT  CHIEF COMPLAINT: Left-sided paresthesias resolved.  Intracranial aneurysm.  Current Pain Level: 1-10  HISTORY OF PRESENT ILLNESS: The patient is a 39 year old right-handed Hispanic male referred for further management of an unruptured intracranial aneurysm on an MRI/MRA of the brain performed in March of 2016.  The patient is here accompanied by his wife and also a Spanish speaking interpreter.  The patient does understand some [place].  The patient apparently was admitted in March of this year with symptoms of numbness and tingling in his left arm, face and leg which apparently resolved over a period of a few days. During the workup in the hospital he was discovered to have a basilar artery apical aneurysm.  The patient is here to discuss management of this intracranial aneurysm.  The patient denies recent worsening of his headaches, visual aberrations, double vision, loss of consciousness, altered mental status, motor or sensory weakness, or station and gait abnormalities.  Past  Medical History: Diabetes mellitus treated with oral hypoglycemics. Hyperlipidemia.  Medications: Aspirin 81 mg a day.  Atorvastatin.  Metformin.  Allergies: No known allergies.  Social History: Married has 2 children. Lives in [place]. He drinks 3-4 beers a week. Used to smoke tobacco until March of 2016. Denies use of illicit chemicals. Presently on a special diet on account of his diabetes.  Family History: Cancer of type unknown. Denies any history of intracranial aneurysms or of stroke. Brother apparently had an abnormal mass over the scalp.  REVIEW OF SYSTEMS: As above, otherwise, negative for pathologic symptomatology.  PHYSICAL EXAMINATION: Mildly depressed.  In no acute distress.  Neurologically fully intact.  ASSESSMENT AND PLAN: The patient's recent MRI of the brain and MRA of the brain were reviewed with him via the interpreter. His wife was also present. The basilar apex aneurysm was shown to the patient.  The natural history of the unruptured intracranial aneurysms was reviewed with the patient. The risk of rupture of 1-2% per year, with attendant significant mortality and morbidity were brought to their attention. Risk factors for increased risk  of rupture and/or enlargement were reviewed. The patient has high blood pressure, high cholesterol and cigarette smoking history.  Options regarding management were those of continued medical management surveillance versus consideration for occlusion of the aneurysm from the parent circulation to avoid growth and potential rupture.  The endovascular option of primary coiling versus coil assisted with stenting, or primary stenting were all reviewed with the patient.  The procedure, the risks, the benefits and the alternatives were all reviewed in complete detail. Via the interpreter it was ascertained regarding full understanding of the patient's disease process and his treatment options. Risk of intraprocedure rupture, with potential for death, need for  emergent surgery and ventilator dependency were all reviewed also. Questions were answered to the patient's satisfaction.  The patient wants to proceed with the endovascular treatment of his basilar apex aneurysm.  He will undergo initially a diagnostic arteriogram followed by endovascular treatment as necessary under general anesthesia right after the catheter angiogram. The patient was also given a prescription of Plavix to be started 5 days prior to the procedure. He and his family were asked to call should they have any concerns or questions.  They all leave with good understanding and agreement with the above management plan.   Electronically Signed   By: Luanne Bras M.D.   On: 06/15/2015 13:25    Labs:  CBC:  Recent Labs  02/06/15 1224 07/07/15 1422  WBC 10.7* 9.3  HGB 17.3* 16.0  HCT 48.4 44.9  PLT 235 249    COAGS:  Recent Labs  02/06/15 1224 07/07/15 1422  INR 1.07 1.04  APTT 27 29    BMP:  Recent Labs  02/06/15 1224 02/23/15 1801 07/07/15 1422  NA 140 138 138  K 3.9 4.1 3.9  CL 102 99 103  CO2 $Re'29 26 24  'hzX$ GLUCOSE 125* 103* 107*  BUN $Re'7 8 9  'Wuz$ CALCIUM 9.3 9.2 9.2  CREATININE 0.75 0.77 0.73  GFRNONAA >90 >89 >60  GFRAA >90 >89 >60    LIVER FUNCTION TESTS:  Recent Labs  02/06/15 1224 02/23/15 1801 07/07/15 1422  BILITOT 1.0 0.7 0.8  AST 191* 63* 27  ALT 216* 102* 29  ALKPHOS 95 95 79  PROT 7.6 7.5 7.1  ALBUMIN 4.2 4.4 4.1    TUMOR MARKERS: No results for input(s): AFPTM, CEA, CA199, CHROMGRNA in the last 8760 hours.  Assessment and Plan:  CVA 01/2015 Incidental basilar artery aneurysm Referred to Dr Estanislado Pandy and consulted for embolization of same Now scheduled for cerebral arteriogram with embolization of basilar apex artery aneurysm Risks and Benefits discussed with the patient including, but not limited to bleeding, infection, vascular injury, contrast induced renal failure, stroke or even death. All of the patient's questions were  answered, patient is agreeable to proceed. Consent signed and in chart.  Pt aware if intervention is performed he will be admitted overnight to Neuro ICU. Plan for discharge following day.  Thank you for this interesting consult.  I greatly enjoyed meeting [employer] and look forward to participating in their care.  A copy of this report was sent to the requesting provider on this date.  Signed: Phillipe Clemon A 07/13/2015, 11:36 AM   I spent a total of  40 Minutes   in face to face in clinical consultation, greater than 50% of which was counseling/coordinating care for cerebral arteriogram with aneurysm embolization

## 2015-07-13 NOTE — Transfer of Care (Signed)
Immediate Anesthesia Transfer of Care Note  Patient: Bobby Thompson  Procedure(s) Performed: Procedure(s): RADIOLOGY WITH ANESTHESIA (N/A)  Patient Location: Short Stay  Anesthesia Type:MAC  Level of Consciousness: awake, alert  and oriented  Airway & Oxygen Therapy: Patient Spontanous Breathing and Patient connected to nasal cannula oxygen  Post-op Assessment: Report given to RN and Post -op Vital signs reviewed and stable  Post vital signs: Reviewed and stable  Last Vitals:  Filed Vitals:   07/13/15 0923  BP: 131/87  Pulse: 75  Temp: 36.4 C  Resp: 18    Complications: No apparent anesthesia complications

## 2015-07-13 NOTE — Anesthesia Preprocedure Evaluation (Addendum)
Anesthesia Evaluation  Patient identified by MRN, date of birth, ID band Patient awake    Reviewed: Allergy & Precautions, H&P , NPO status , Patient's Chart, lab work & pertinent test results  History of Anesthesia Complications (+) PONV  Airway Mallampati: III  TM Distance: >3 FB Neck ROM: Full    Dental no notable dental hx. (+) Teeth Intact, Dental Advisory Given   Pulmonary neg pulmonary ROS, former smoker,  breath sounds clear to auscultation  Pulmonary exam normal       Cardiovascular negative cardio ROS  Rhythm:Regular Rate:Normal     Neuro/Psych Anxiety CVA    GI/Hepatic negative GI ROS, Neg liver ROS,   Endo/Other  diabetes, Type 2, Oral Hypoglycemic AgentsMorbid obesity  Renal/GU negative Renal ROS  negative genitourinary   Musculoskeletal   Abdominal   Peds  Hematology negative hematology ROS (+)   Anesthesia Other Findings   Reproductive/Obstetrics negative OB ROS                            Anesthesia Physical Anesthesia Plan  ASA: III  Anesthesia Plan: General   Post-op Pain Management:    Induction: Intravenous  Airway Management Planned: Oral ETT and Video Laryngoscope Planned  Additional Equipment: Arterial line  Intra-op Plan:   Post-operative Plan: Extubation in OR and Possible Post-op intubation/ventilation  Informed Consent: I have reviewed the patients History and Physical, chart, labs and discussed the procedure including the risks, benefits and alternatives for the proposed anesthesia with the patient or authorized representative who has indicated his/her understanding and acceptance.   Dental advisory given  Plan Discussed with: CRNA and Surgeon  Anesthesia Plan Comments:        Anesthesia Quick Evaluation

## 2015-07-13 NOTE — Sedation Documentation (Addendum)
Manual pressure held to right groin by Lynann Beaver, RT, interpreter explaining process.

## 2015-07-13 NOTE — Discharge Instructions (Signed)

## 2015-07-13 NOTE — Anesthesia Postprocedure Evaluation (Signed)
  Anesthesia Post-op Note  Patient: Bobby Thompson  Procedure(s) Performed: Procedure(s): RADIOLOGY WITH ANESTHESIA (N/A)  Patient Location: PACU  Anesthesia Type: MAC  Level of Consciousness: awake and alert   Airway and Oxygen Therapy: Patient Spontanous Breathing  Post-op Pain: Controlled  Post-op Assessment: Post-op Vital signs reviewed, Patient's Cardiovascular Status Stable and Respiratory Function Stable  Post-op Vital Signs: Reviewed  Filed Vitals:   07/13/15 1345  BP: 126/76  Pulse: 69  Temp:   Resp: 14    Complications: No apparent anesthesia complications

## 2015-07-13 NOTE — Progress Notes (Signed)
Pt reports taking  of Plavix and  of aspirin this morning. Dr. Corliss Skains at bedside and aware. Okay to give  aspirin now. Dr. Corliss Skains also informed of BP of 131/87, order given to NOT give any Nimotop.

## 2015-07-14 ENCOUNTER — Encounter (HOSPITAL_COMMUNITY): Payer: Self-pay | Admitting: Interventional Radiology

## 2015-12-11 ENCOUNTER — Ambulatory Visit: Payer: Self-pay | Admitting: Neurology

## 2015-12-14 MED FILL — ATORVASTATIN 20 MG TABLET: 20 | 30 days supply | Qty: 30 | Fill #3

## 2015-12-14 MED FILL — metFORMIN HCL 500 MG TABS: 500 | 30 days supply | Qty: 60 | Fill #2

## 2016-01-29 MED FILL — metFORMIN HCL 500 MG TABS: 500 | 30 days supply | Qty: 60 | Fill #3

## 2016-02-14 MED FILL — ?ATORVASTATIN 20 MG TABLET: 20 | 30 days supply | Qty: 30 | Fill #4

## 2016-03-26 MED FILL — ?ATORVASTATIN 20 MG TABLET: 20 | 30 days supply | Qty: 30 | Fill #5

## 2016-03-26 MED FILL — metFORMIN HCL 500 MG TABS: 500 | 30 days supply | Qty: 60 | Fill #4

## 2016-05-28 ENCOUNTER — Other Ambulatory Visit: Payer: Self-pay | Admitting: Family Medicine

## 2017-02-22 IMAGING — CT CT HEAD W/O CM
1 series · 16 of 30 positions shown, 20 images · non-contrast
Comparison: None.

CLINICAL DATA: Left-sided weakness and numbness

EXAM:
CT HEAD WITHOUT CONTRAST
TECHNIQUE: Contiguous axial images were obtained from the base of the skull
through the vertex without intravenous contrast.

[Series 2: head 5.0 h30s · axial · 0.45mm/px · z∈[-131,+9]mm · 16 of 32 slices shown, 20 images]
[im 2/32  brain]
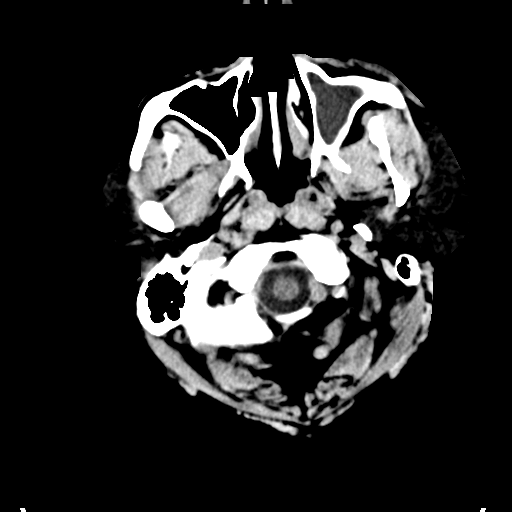
[im 2/32  bone]
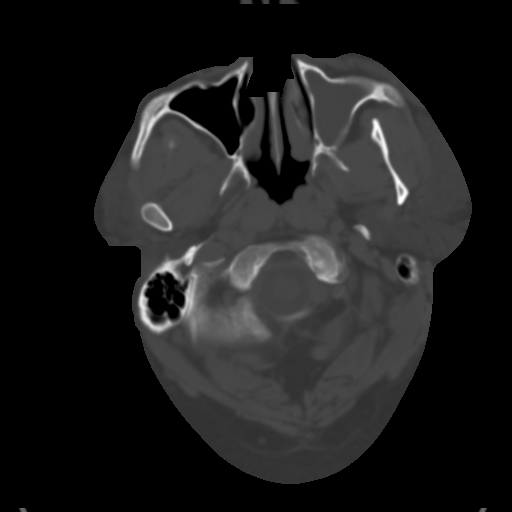
[im 4/32  brain]
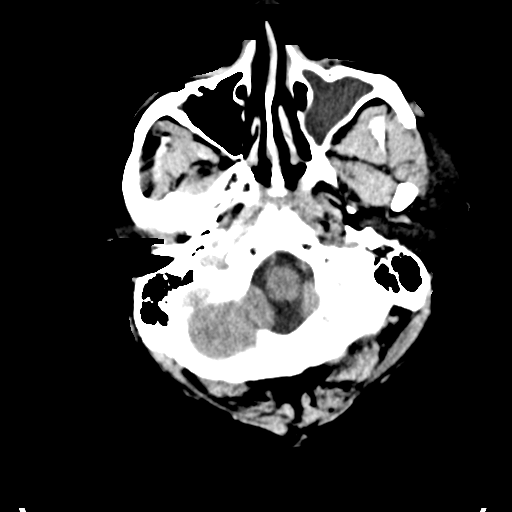
[im 6/32  brain]
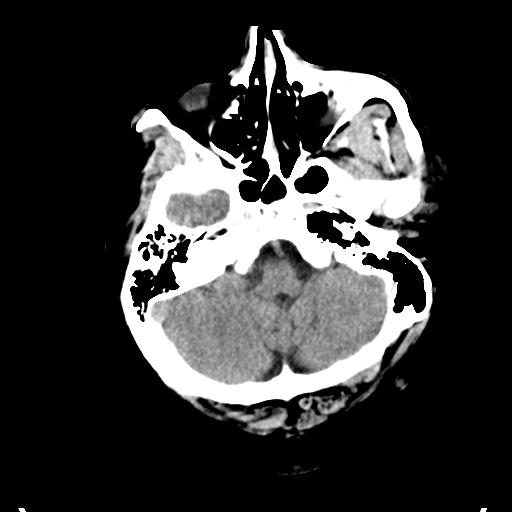
[im 8/32  brain]
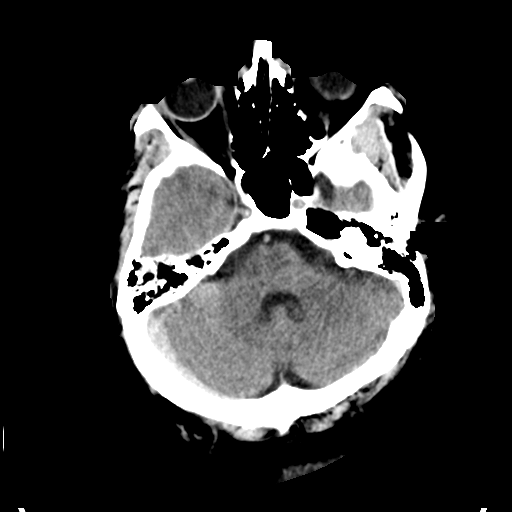
[im 9/32  brain]
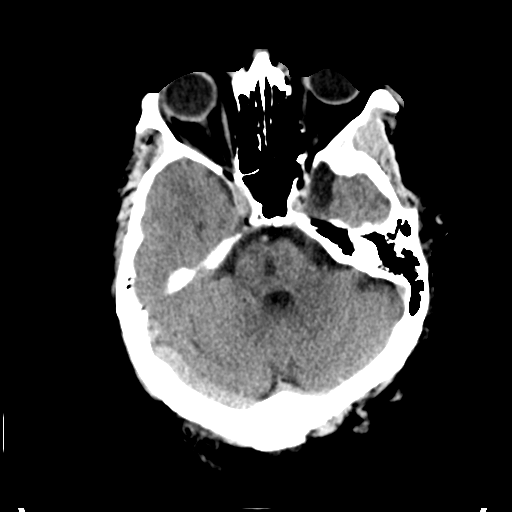
[im 9/32  bone]
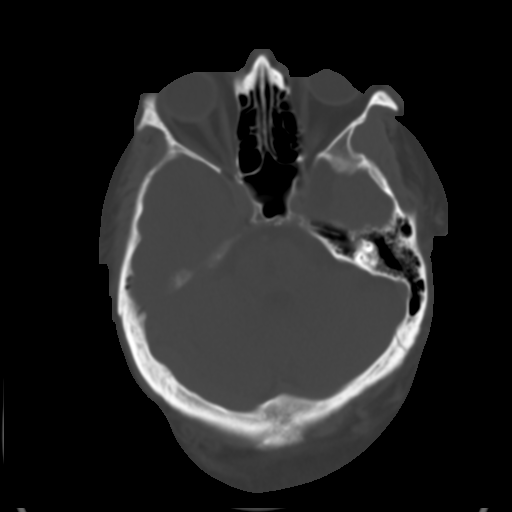
[im 11/32  brain]
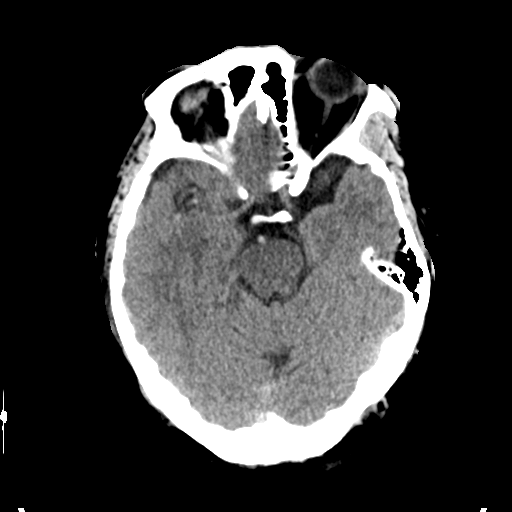
[im 13/32  brain]
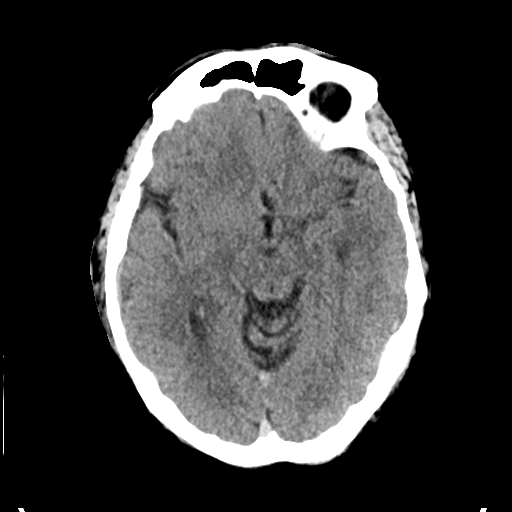
[im 15/32  brain]
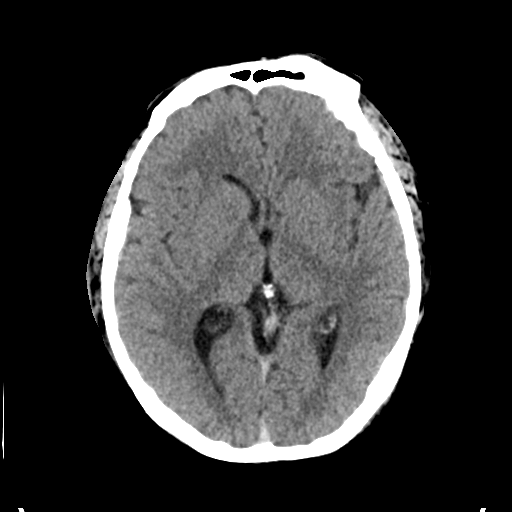
[im 17/32  brain]
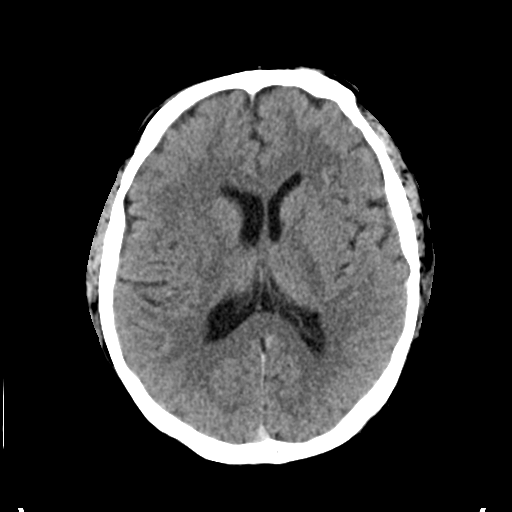
[im 17/32  bone]
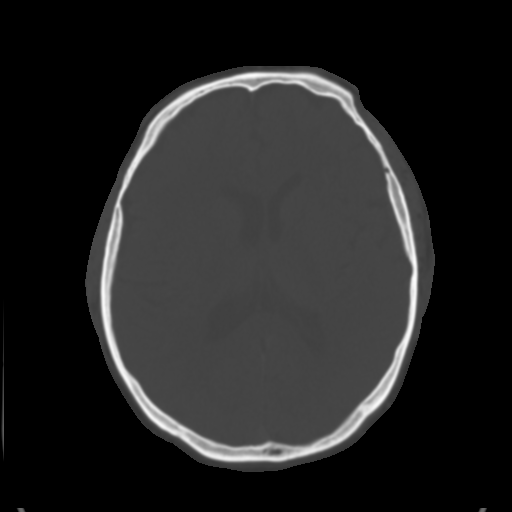
[im 19/32  brain]
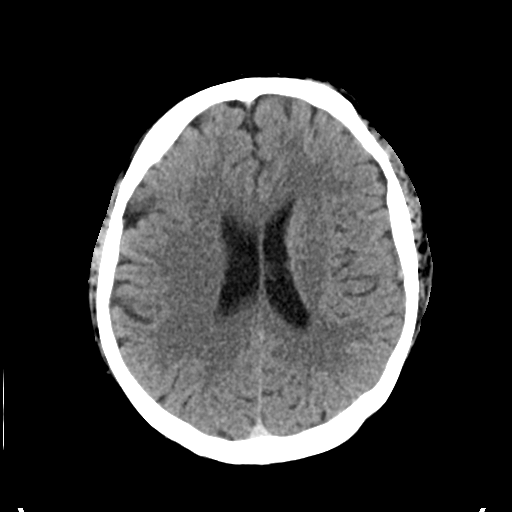
[im 21/32  brain]
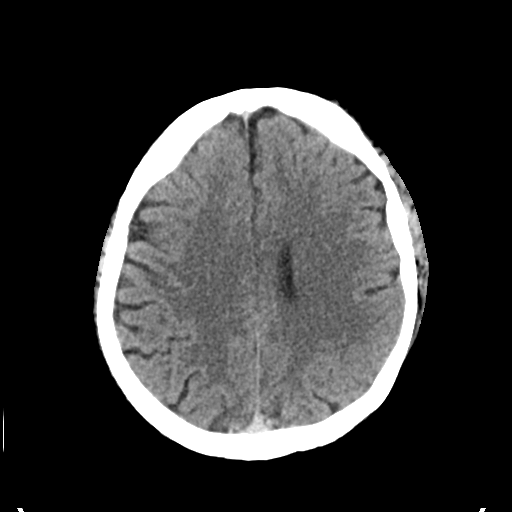
[im 23/32  brain]
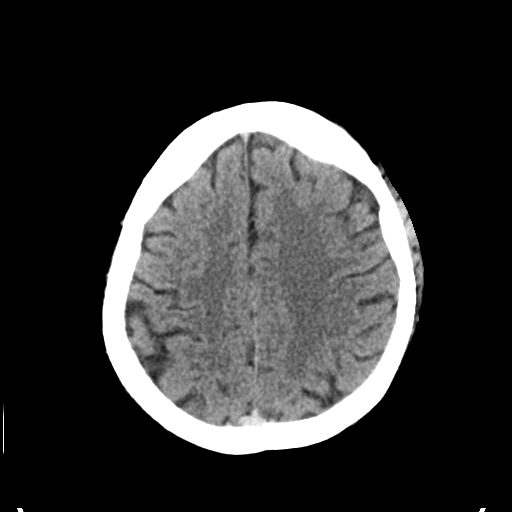
[im 24/32  brain]
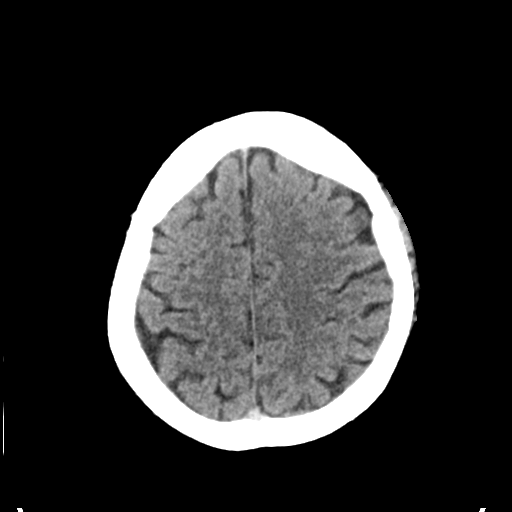
[im 24/32  bone]
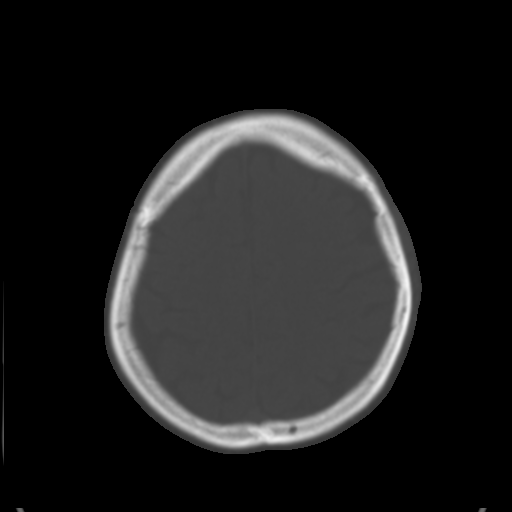
[im 26/32  brain]
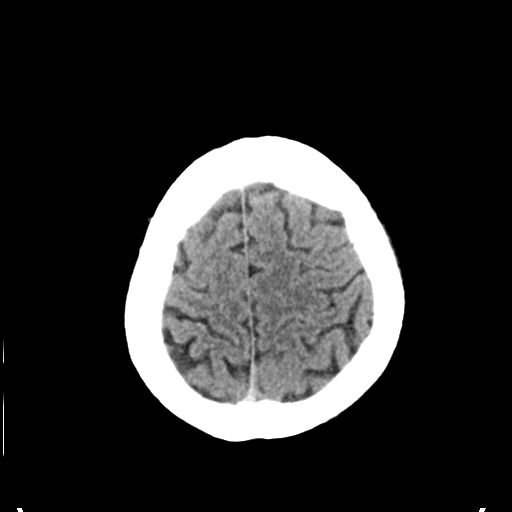
[im 28/32  brain]
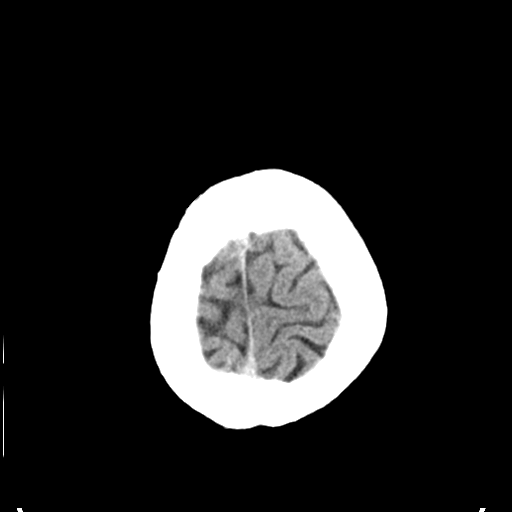
[im 30/32  brain]
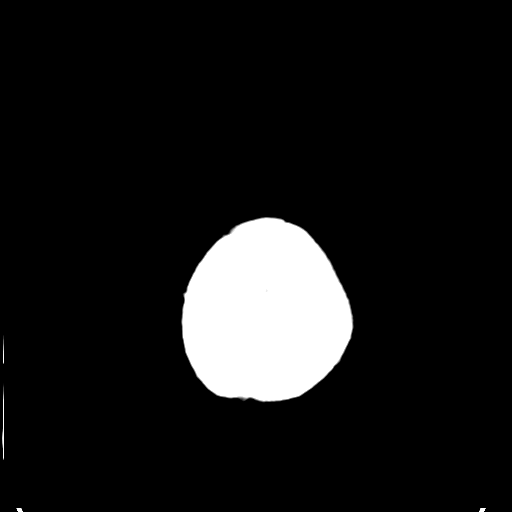

[16 of 30 positions shown; findings below may reference images not displayed]

FINDINGS: Bony calvarium is intact. Mucosal retention cysts are noted within
the maxillary antra bilaterally. Changes consistent with prior
lacunar infarcts are noted in the midline of the pons as well as the
right side of the pons. These would correspond with the patient's
given clinical history of prior stroke-like symptoms. No findings to
suggest acute hemorrhage, acute infarction or space-occupying mass
lesion are noted.
IMPRESSION: Chronic changes as described.  No acute abnormality is noted.

## 2017-02-22 IMAGING — MR MR HEAD W/O CM
9 of 11 series · 30 of 48 positions shown · non-contrast
Comparison: CT head without contrast 3315 hours today.

CLINICAL DATA: 39-year-old male who awoke with left side numbness
today. TIA. Initial encounter.

EXAM:
MRI HEAD WITHOUT CONTRAST
MRA HEAD WITHOUT CONTRAST
TECHNIQUE: Multiplanar, multiecho pulse sequences of the brain and surrounding
structures were obtained without intravenous contrast. Angiographic
images of the head were obtained using MRA technique without
contrast.

[Series 3: DWI · axial · 3.0mm · 1.09mm/px · z∈[-23,+120]mm · 6 of 98 slices shown (1 of 4)]
[im 1/98]
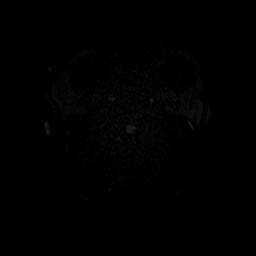
[im 20/98]
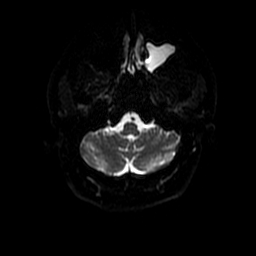
[im 39/98]
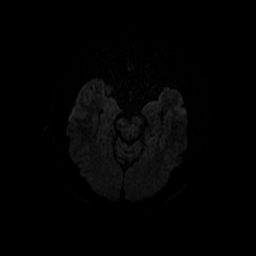
[im 59/98]
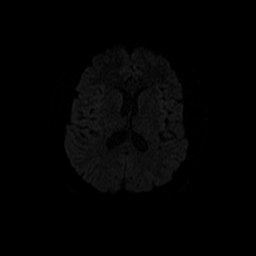
[im 78/98]
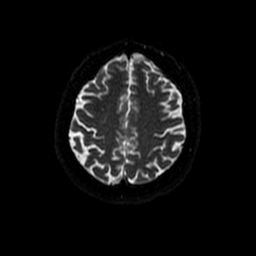
[im 98/98]
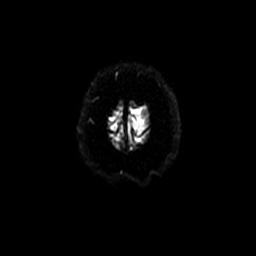

[Series 4: DWI · coronal · 5.0mm · 1.09mm/px · 5 of 66 slices shown (2 of 4)]
[im 1/66]
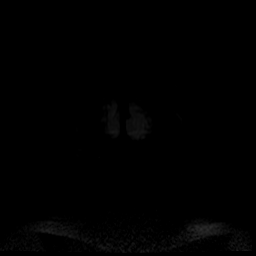
[im 17/66]
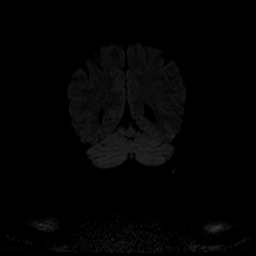
[im 33/66]
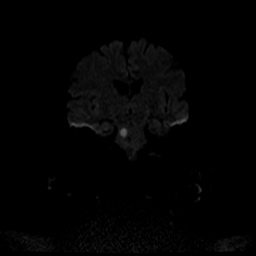
[im 49/66]
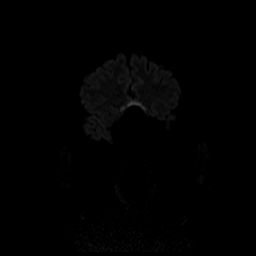
[im 66/66]
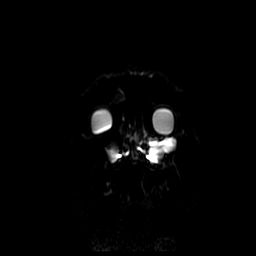

[Series 5: (id) mt fs · axial · 1.2mm · 0.45mm/px · z∈[-57,+6]mm · 5 of 196 slices shown]
[im 1/196]
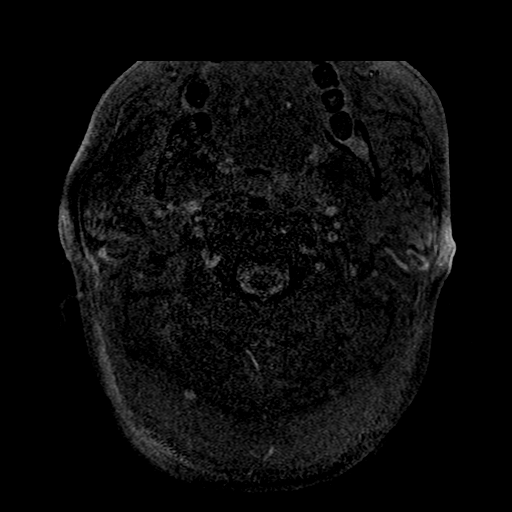
[im 31/196]
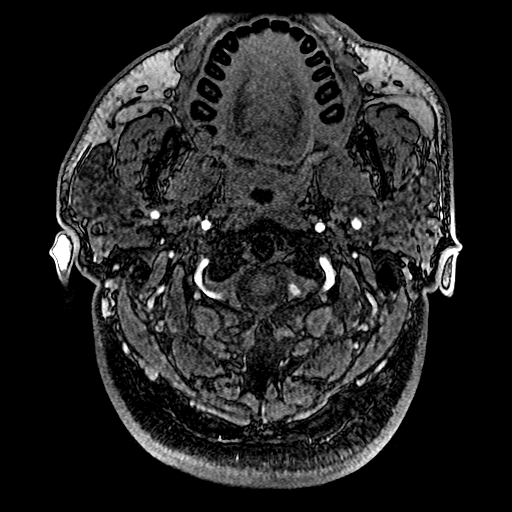
[im 61/196]
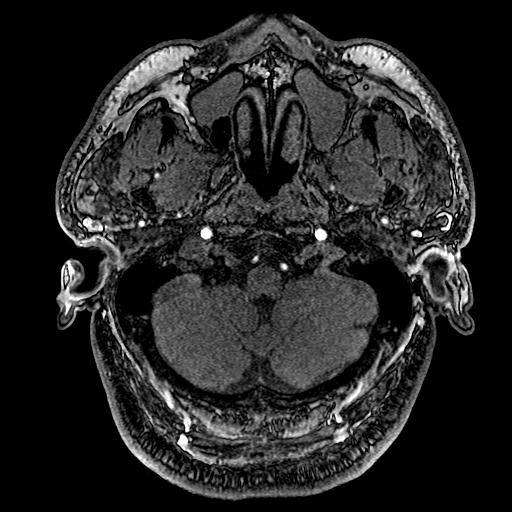
[im 91/196]
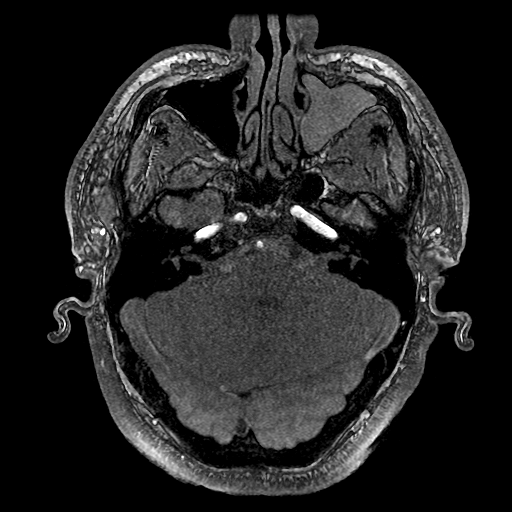
[im 106/196]
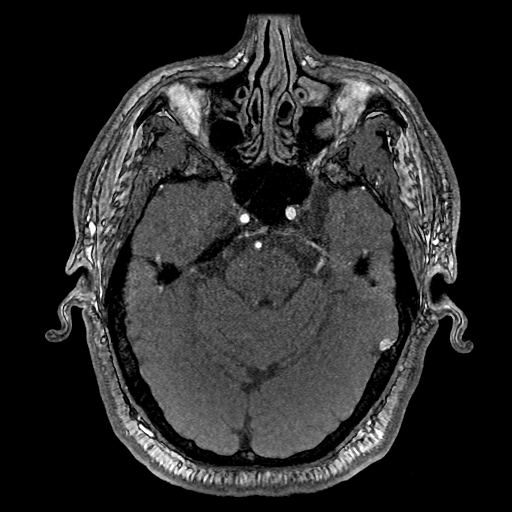

[Series 6: T1 · sagittal · 5.0mm · 0.47mm/px · 2 of 24 slices shown]
[im 1/24]
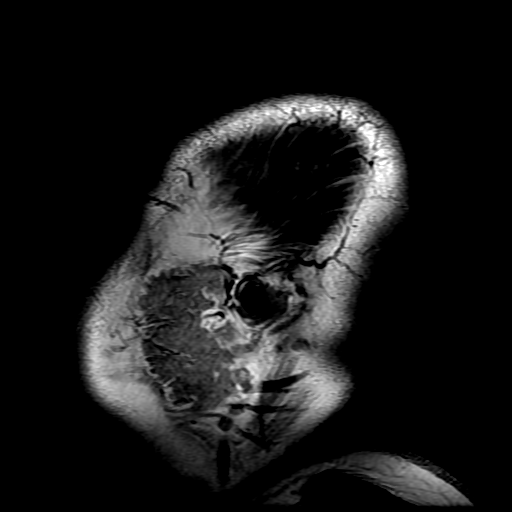
[im 24/24]
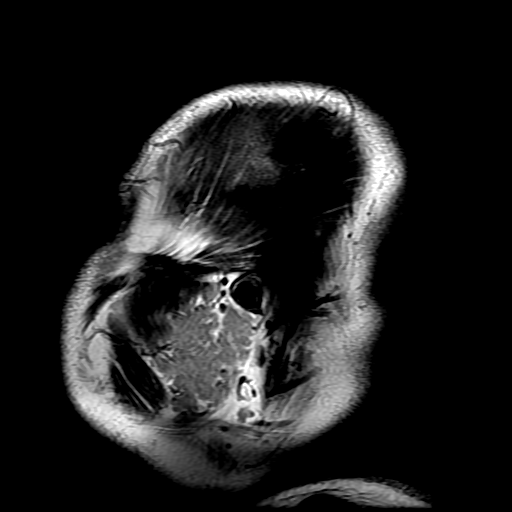

[Series 7: T2 · axial · 5.0mm · 0.43mm/px · z∈[-19,+117]mm · 2 of 24 slices shown (1 of 2)]
[im 1/24]
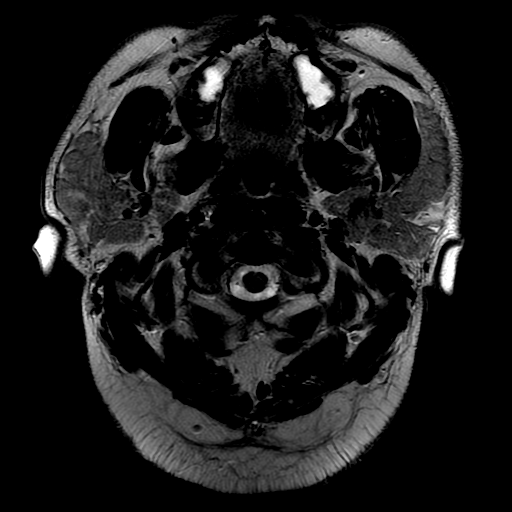
[im 24/24]
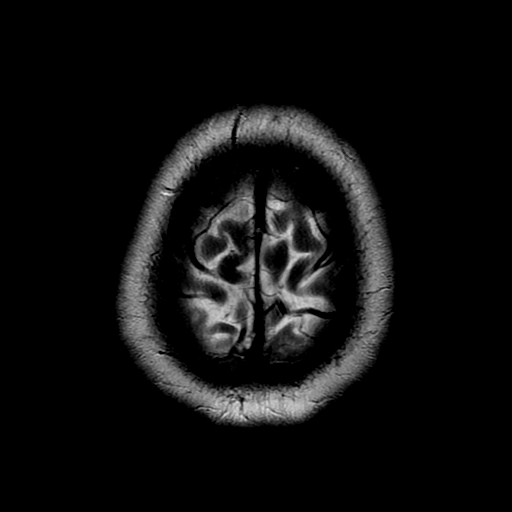

[Series 8: FLAIR · axial · 5.0mm · 0.43mm/px · z∈[-19,+117]mm · 2 of 24 slices shown]
[im 1/24]
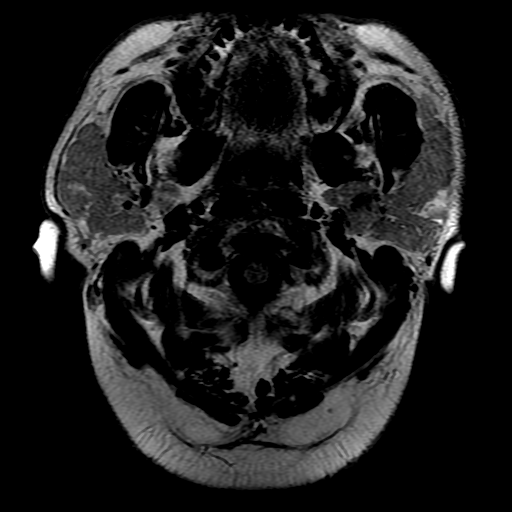
[im 24/24]
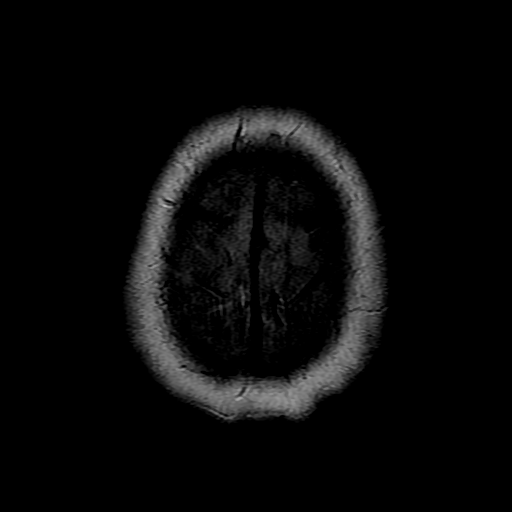

[Series 11: T2 · coronal · 5.0mm · 0.39mm/px · 2 of 25 slices shown (2 of 2)]
[im 1/25]
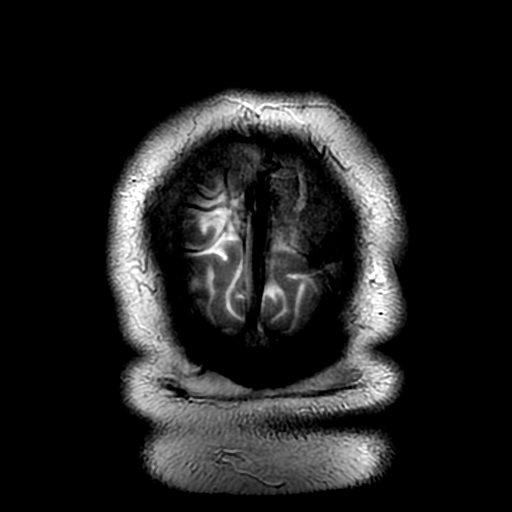
[im 25/25]
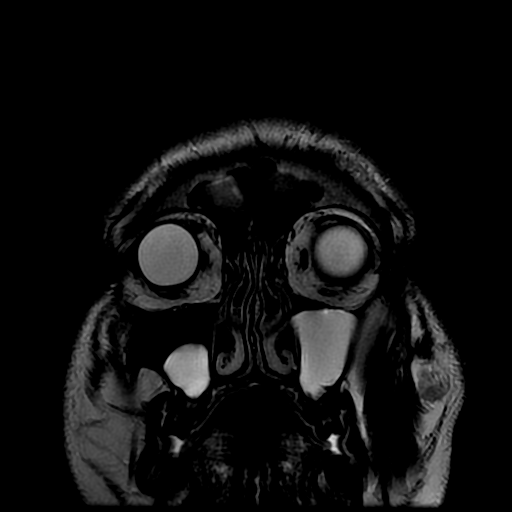

[Series 300: DWI · axial · 3.0mm · 1.09mm/px · z∈[-23,+120]mm · 4 of 49 slices shown (3 of 4)]
[im 1/49]
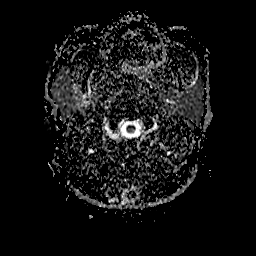
[im 17/49]
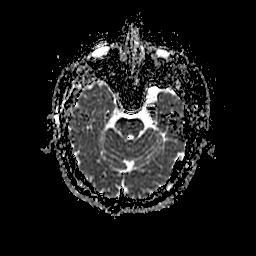
[im 33/49]
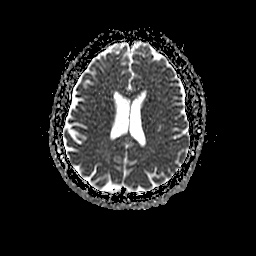
[im 49/49]
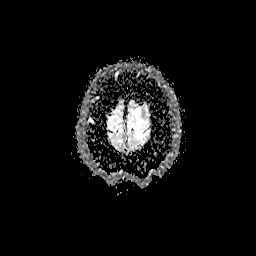

[Series 400: DWI · coronal · 5.0mm · 1.09mm/px · 2 of 33 slices shown (4 of 4)]
[im 1/33]
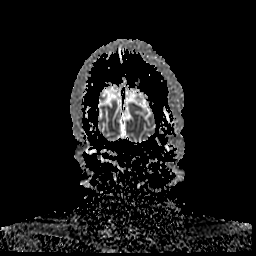
[im 33/33]
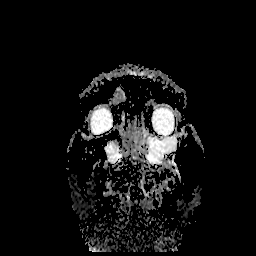

[30 of 48 positions shown; findings below may reference images not displayed]

FINDINGS: MRI HEAD FINDINGS

There is confluent restricted diffusion in the right paracentral
pons with corresponding T2 and FLAIR hyperintensity. This is just
lateral and cephalad to a chronic more central pontine lacunar
infarct. No associated hemorrhage or significant mass effect.

No other restricted diffusion. Major intracranial vascular flow
voids are preserved. However, there is enlargement of the basilar
artery tip measuring up to 6-7 mm. See MRA findings below.

No other posterior fossa chronic ischemic disease. Supratentorial
gray and white matter signal is within normal limits. No midline
shift, mass effect, evidence of mass lesion, ventriculomegaly,
extra-axial collection or acute intracranial hemorrhage.
Cervicomedullary junction and pituitary are within normal limits.
Negative visualized cervical spine.

Visible internal auditory structures appear normal. Mastoids are
clear. Left greater than right maxillary sinus mucous retention
cyst. Visualized orbit soft tissues are within normal limits.
Negative scalp soft tissues. Bone marrow signal within normal
limits.

MRA HEAD FINDINGS

Antegrade flow in the distal vertebral arteries, the left is mildly
dominant. The distal right vertebral artery is diminutive beyond the
patent right PICA origin. No distal vertebral artery stenosis.
Patent vertebrobasilar junction. Mild basilar artery irregularity
with no stenosis.

Heterogeneous flow signal within the distal basilar artery
compatible with an up to 8 mm basilar tip aneurysm involving the PCA
origins, more so the left. Posterior communicating arteries are
diminutive or absent. Bilateral PCA branches remain within normal
limits.

Antegrade flow in both ICA siphons. No siphon stenosis. Ophthalmic
artery origins are within normal limits. Normal carotid termini, MCA
and ACA origins. Anterior communicating artery and visualized ACA
branches within normal limits. Visualized bilateral MCA branches are
within normal limits.
IMPRESSION: 1. Acute on chronic brainstem ischemia; Acute right paracentral pons
lacunar infarct. No associated mass effect or hemorrhage.
2. Basilar tip aneurysm, 8 mm. Mild basilar artery irregularity
elsewhere, no posterior circulation stenosis or major circle of
Willis branch occlusion identified.

## 2018-02-11 ENCOUNTER — Encounter (HOSPITAL_COMMUNITY): Payer: Self-pay | Admitting: Emergency Medicine

## 2018-02-11 ENCOUNTER — Emergency Department (HOSPITAL_COMMUNITY)
Admission: EM | Admit: 2018-02-11 | Discharge: 2018-02-11 | Disposition: A | Discharge status: Home / Self Care | Payer: Self-pay | Attending: Emergency Medicine | Admitting: Emergency Medicine

## 2018-02-11 DIAGNOSIS — E119 Type 2 diabetes mellitus without complications: Secondary | ICD-10-CM | POA: Insufficient documentation

## 2018-02-11 DIAGNOSIS — T148XXA Other injury of unspecified body region, initial encounter: Secondary | ICD-10-CM

## 2018-02-11 DIAGNOSIS — Z79899 Other long term (current) drug therapy: Secondary | ICD-10-CM | POA: Insufficient documentation

## 2018-02-11 DIAGNOSIS — Z7984 Long term (current) use of oral hypoglycemic drugs: Secondary | ICD-10-CM | POA: Insufficient documentation

## 2018-02-11 DIAGNOSIS — X58XXXA Exposure to other specified factors, initial encounter: Secondary | ICD-10-CM | POA: Insufficient documentation

## 2018-02-11 DIAGNOSIS — S30813A Abrasion of scrotum and testes, initial encounter: Secondary | ICD-10-CM | POA: Insufficient documentation

## 2018-02-11 DIAGNOSIS — Y929 Unspecified place or not applicable: Secondary | ICD-10-CM | POA: Insufficient documentation

## 2018-02-11 DIAGNOSIS — Z7982 Long term (current) use of aspirin: Secondary | ICD-10-CM | POA: Insufficient documentation

## 2018-02-11 DIAGNOSIS — Y93E1 Activity, personal bathing and showering: Secondary | ICD-10-CM | POA: Insufficient documentation

## 2018-02-11 DIAGNOSIS — Y999 Unspecified external cause status: Secondary | ICD-10-CM | POA: Insufficient documentation

## 2018-02-11 DIAGNOSIS — Z87891 Personal history of nicotine dependence: Secondary | ICD-10-CM | POA: Insufficient documentation

## 2018-02-11 NOTE — ED Triage Notes (Addendum)
Reports taking a shower tonight when he noticed bleeding from left testicle.  Reports as "a lot" of bleeding.  Controlled with a wash cloth against it at this time.  Looks like a bump that has busted.

## 2018-02-11 NOTE — Discharge Instructions (Signed)
Follow-up the primary care doctor in the next 2-4 days.  He did not have a primary care doctor, he can use the [facility].  Return emergency department for any pain or swelling of your testicles, warmth of your testicles, redness of your testicles, worsening sores, bleeding or any other worsening or concerning symptoms.

## 2018-02-12 NOTE — ED Provider Notes (Signed)
[facility] EMERGENCY DEPARTMENT Provider Note   CSN: [id number] Arrival date & time: 02/11/18  1934     History   Chief Complaint Chief Complaint  Patient presents with  . bleeding to testicle    HPI Bobby Thompson is a 42 y.o. male who presents for evaluation of bleeding to the left testicle that began today.  Patient reports that he was in the shower, he noticed some small amount of blood from the left testicle that was on the towel.  Patient reports that there is a small area that appeared to be where the bleeding was from.  He denies any preceding trauma, injury, fall.  Patient reports that the bleeding is controlled on ED arrival.  Patient denies any testicular pain, redness, swelling, penile pain, penile discharge.  Patient reports he has not been having any fevers, dysuria, hematuria.  The history is provided by the patient.    Past Medical History:  Diagnosis Date  . Anxiety    afraid of heights, different fears have surfaced since stroke   . Complication of anesthesia   . Diabetes mellitus without complication [facility]) March 2016  . Hyperlipidemia March 2016  . PONV (postoperative nausea and vomiting)   . Stroke [facility])     Patient Active Problem List   Diagnosis Date Noted  . Brain aneurysm   . Tinea pedis 02/23/2015  . Elevated transaminase level 02/23/2015  . Dyslipidemia 02/10/2015  . Diabetes mellitus type 2, controlled ([place]) 02/09/2015  . CVA (cerebral vascular accident) ([place]) 02/06/2015  . Facial droop     Past Surgical History:  Procedure Laterality Date  . PILONIDAL CYST EXCISION    . RADIOLOGY WITH ANESTHESIA N/A 07/13/2015   Procedure: RADIOLOGY WITH ANESTHESIA;  Surgeon: Luanne Bras, MD;  Location: [place];  Service: Radiology;  Laterality: N/A;        Home Medications    Prior to Admission medications   Medication Sig Start Date End Date Taking? Authorizing Provider  aspirin EC 81 MG EC tablet Take 1 tablet (81 mg  total) by mouth daily. 02/08/15   Emokpae, Ejiroghene E, MD  atorvastatin (LIPITOR) 20 MG tablet Take 1 tablet (20 mg total) by mouth daily at 6 PM. Must have office visit for refills 05/29/16   Boykin Nearing, MD  Blood Glucose Monitoring Suppl (TRUE METRIX METER) W/DEVICE KIT 1 each by Does not apply route as needed. 05/19/15   Funches, Adriana Mccallum, MD  glucose blood (TRUE METRIX BLOOD GLUCOSE TEST) test strip 1 each by Other route 3 (three) times daily. 05/19/15   Boykin Nearing, MD  metFORMIN (GLUCOPHAGE) 500 MG tablet Take 1 tablet (500 mg total) by mouth daily with breakfast. Must have office visit for refills 05/29/16   Boykin Nearing, MD  nicotine polacrilex (NICORETTE) 4 MG gum Take 4 mg by mouth 3 (three) times daily as needed for smoking cessation.    [provider]  TRUEPLUS LANCETS 28G MISC 1 each by Does not apply route 3 (three) times daily. 05/19/15   Boykin Nearing, MD    Family History Family History  Problem Relation Age of Onset  . Diabetes Father     Social History Social History   Tobacco Use  . Smoking status: Former Smoker    Last attempt to quit: 02/04/2015    Years since quitting: 3.0  . Smokeless tobacco: Never Used  . Tobacco comment: last cigarette 02/06/15  Substance Use Topics  . Alcohol use: Yes    Alcohol/week: 3.6  oz    Types: 6 Cans of beer per week    Comment: 2-3 times per week a couple of beers   . Drug use: No     Allergies   Patient has no known allergies.   Review of Systems Review of Systems  Constitutional: Negative for fever.  Genitourinary: Negative for dysuria, hematuria, penile pain, penile swelling, scrotal swelling and testicular pain.     Physical Exam Updated Vital Signs BP 126/79   Pulse 73   Temp 98.1 F (36.7 C) (Oral)   Resp 20   Ht '5\' 7"'$  (1.702 m)   Wt 113.4 kg (250 lb)   SpO2 100%   BMI 39.16 kg/m   Physical Exam  Constitutional: He appears well-developed and well-nourished.  HENT:  Head:  Normocephalic and atraumatic.  Eyes: Conjunctivae and EOM are normal. Right eye exhibits no discharge. Left eye exhibits no discharge. No scleral icterus.  Pulmonary/Chest: Effort normal.  Abdominal: Hernia confirmed negative in the right inguinal area and confirmed negative in the left inguinal area.  Genitourinary: Testes normal and penis normal. Right testis shows no swelling and no tenderness. Left testis shows no swelling and no tenderness.  Genitourinary Comments: The exam was performed with a chaperone present. Normal male genitalia. No evidence of rash, ulcers or lesions.  Left testicle with a small 1 mm scabbed area that appears to be where there was a small abrasion to the hair follicle.  No active bleeding.  Testicles are without any warmth, erythema, swelling.  No discoloration.  No tenderness palpation to the bilateral testicles.  Neurological: He is alert.  Skin: Skin is warm and dry.  Psychiatric: He has a normal mood and affect. His speech is normal and behavior is normal.  Nursing note and vitals reviewed.    ED Treatments / Results  Labs (all labs ordered are listed, but only abnormal results are displayed) Labs Reviewed - No data to display  EKG None  Radiology No results found.  Procedures Procedures (including critical care time)  Medications Ordered in ED Medications - No data to display   Initial Impression / Assessment and Plan / ED Course  I have reviewed the triage vital signs and the nursing notes.  Pertinent labs & imaging results that were available during my care of the patient were reviewed by me and considered in my medical decision making (see chart for details).     42 year old male who presents for evaluation of left-sided testicular bleeding that began this evening while in the shower.No testicular pain, redness, swelling, erythema, penile pain, penile discharge.  Bleeding controlled on ED arrival.  On exam, patient is a small scabbed over  area that appears to be where a hair follicle have either been removed or had an abrasion.  Testicles are without any overlying warmth, erythema, swelling, tenderness or be concerning for epididymitis, orchitis.  Patient currently not having any pain in the testicles.  Exam is not concerning for testicular torsion, hernia. Hiistory/physical exam is not concerning for Fournier's gangrene. No evidence for ultrasound imaging at this time.  Suspect this was likely due to a small abrasion that was scratched while taking shower.  Instructed patient to follow-up with his primary care doctor in the next 24 to 48 hours for further evaluation. Patient had ample opportunity for questions and discussion. All patient's questions were answered with full understanding. Strict return precautions discussed. Patient expresses understanding and agreement to plan.   Final Clinical Impressions(s) / ED Diagnoses  Final diagnoses:  Abrasion    ED Discharge Orders    None       Volanda Napoleon, PA-C 02/12/18 0029    Ward, Delice Bison, DO 02/12/18 0230

## 2024-05-02 ENCOUNTER — Encounter (HOSPITAL_COMMUNITY): Payer: Self-pay

## 2024-05-02 ENCOUNTER — Inpatient Hospital Stay (HOSPITAL_COMMUNITY)
Admission: EM | Admit: 2024-05-02 | Discharge: 2024-05-04 | DRG: 193 | Disposition: A | Discharge status: Home / Self Care | Payer: Self-pay | Attending: Internal Medicine | Admitting: Internal Medicine

## 2024-05-02 ENCOUNTER — Emergency Department (HOSPITAL_COMMUNITY): Payer: Self-pay

## 2024-05-02 ENCOUNTER — Other Ambulatory Visit: Payer: Self-pay

## 2024-05-02 DIAGNOSIS — F419 Anxiety disorder, unspecified: Secondary | ICD-10-CM | POA: Diagnosis present

## 2024-05-02 DIAGNOSIS — Z87891 Personal history of nicotine dependence: Secondary | ICD-10-CM

## 2024-05-02 DIAGNOSIS — Z7984 Long term (current) use of oral hypoglycemic drugs: Secondary | ICD-10-CM

## 2024-05-02 DIAGNOSIS — I10 Essential (primary) hypertension: Secondary | ICD-10-CM | POA: Diagnosis present

## 2024-05-02 DIAGNOSIS — Z8673 Personal history of transient ischemic attack (TIA), and cerebral infarction without residual deficits: Secondary | ICD-10-CM

## 2024-05-02 DIAGNOSIS — R079 Chest pain, unspecified: Secondary | ICD-10-CM | POA: Diagnosis present

## 2024-05-02 DIAGNOSIS — E871 Hypo-osmolality and hyponatremia: Secondary | ICD-10-CM | POA: Diagnosis present

## 2024-05-02 DIAGNOSIS — J189 Pneumonia, unspecified organism: Principal | ICD-10-CM | POA: Diagnosis present

## 2024-05-02 DIAGNOSIS — J9601 Acute respiratory failure with hypoxia: Secondary | ICD-10-CM | POA: Diagnosis not present

## 2024-05-02 DIAGNOSIS — F1290 Cannabis use, unspecified, uncomplicated: Secondary | ICD-10-CM | POA: Diagnosis present

## 2024-05-02 DIAGNOSIS — F10230 Alcohol dependence with withdrawal, uncomplicated: Secondary | ICD-10-CM | POA: Diagnosis present

## 2024-05-02 DIAGNOSIS — E66811 Obesity, class 1: Secondary | ICD-10-CM | POA: Diagnosis present

## 2024-05-02 DIAGNOSIS — F10939 Alcohol use, unspecified with withdrawal, unspecified: Secondary | ICD-10-CM | POA: Diagnosis present

## 2024-05-02 DIAGNOSIS — E119 Type 2 diabetes mellitus without complications: Secondary | ICD-10-CM | POA: Diagnosis present

## 2024-05-02 DIAGNOSIS — F1410 Cocaine abuse, uncomplicated: Secondary | ICD-10-CM | POA: Diagnosis present

## 2024-05-02 DIAGNOSIS — F10930 Alcohol use, unspecified with withdrawal, uncomplicated: Principal | ICD-10-CM

## 2024-05-02 DIAGNOSIS — E785 Hyperlipidemia, unspecified: Secondary | ICD-10-CM | POA: Diagnosis present

## 2024-05-02 DIAGNOSIS — Z7982 Long term (current) use of aspirin: Secondary | ICD-10-CM

## 2024-05-02 DIAGNOSIS — Z833 Family history of diabetes mellitus: Secondary | ICD-10-CM

## 2024-05-02 DIAGNOSIS — E669 Obesity, unspecified: Secondary | ICD-10-CM | POA: Diagnosis present

## 2024-05-02 DIAGNOSIS — Z6832 Body mass index (BMI) 32.0-32.9, adult: Secondary | ICD-10-CM

## 2024-05-02 DIAGNOSIS — G9341 Metabolic encephalopathy: Secondary | ICD-10-CM | POA: Diagnosis present

## 2024-05-02 DIAGNOSIS — E878 Other disorders of electrolyte and fluid balance, not elsewhere classified: Secondary | ICD-10-CM | POA: Diagnosis present

## 2024-05-02 DIAGNOSIS — E876 Hypokalemia: Secondary | ICD-10-CM | POA: Diagnosis not present

## 2024-05-02 LAB — CBC
HCT: 42.5 % (ref 39.0–52.0)
Hemoglobin: 14.9 g/dL (ref 13.0–17.0)
MCH: 31.8 pg (ref 26.0–34.0)
MCHC: 35.1 g/dL (ref 30.0–36.0)
MCV: 90.6 fL (ref 80.0–100.0)
Platelets: 233 10*3/uL (ref 150–400)
RBC: 4.69 MIL/uL (ref 4.22–5.81)
RDW: 12.4 % (ref 11.5–15.5)
WBC: 11.5 10*3/uL — ABNORMAL HIGH (ref 4.0–10.5)
nRBC: 0 % (ref 0.0–0.2)

## 2024-05-02 LAB — RAPID URINE DRUG SCREEN, HOSP PERFORMED
Amphetamines: NOT DETECTED
Barbiturates: NOT DETECTED
Benzodiazepines: NOT DETECTED
Cocaine: NOT DETECTED
Opiates: NOT DETECTED
Tetrahydrocannabinol: POSITIVE — AB

## 2024-05-02 LAB — BASIC METABOLIC PANEL WITH GFR
Anion gap: 13 (ref 5–15)
BUN: 14 mg/dL (ref 6–20)
CO2: 21 mmol/L — ABNORMAL LOW (ref 22–32)
Calcium: 8.4 mg/dL — ABNORMAL LOW (ref 8.9–10.3)
Chloride: 95 mmol/L — ABNORMAL LOW (ref 98–111)
Creatinine, Ser: 0.8 mg/dL (ref 0.61–1.24)
GFR, Estimated: >60 mL/min (ref >60)
Glucose, Bld: 136 mg/dL — ABNORMAL HIGH (ref 70–99)
Potassium: 3.7 mmol/L (ref 3.5–5.1)
Sodium: 129 mmol/L — ABNORMAL LOW (ref 135–145)

## 2024-05-02 LAB — MAGNESIUM: Magnesium: 1.6 mg/dL — ABNORMAL LOW (ref 1.7–2.4)

## 2024-05-02 LAB — ETHANOL: Alcohol, Ethyl (B): <15 mg/dL (ref <15)

## 2024-05-02 LAB — TROPONIN I (HIGH SENSITIVITY)
Troponin I (High Sensitivity): 18 ng/L — ABNORMAL HIGH (ref <18)
Troponin I (High Sensitivity): 20 ng/L — ABNORMAL HIGH (ref <18)

## 2024-05-02 LAB — CBG MONITORING, ED: Glucose-Capillary: 137 mg/dL — ABNORMAL HIGH (ref 70–99)

## 2024-05-02 LAB — BRAIN NATRIURETIC PEPTIDE: B Natriuretic Peptide: 29.1 pg/mL (ref 0.0–100.0)

## 2024-05-02 MED ORDER — ASPIRIN 81 MG PO CHEW
324.0000 mg | CHEWABLE_TABLET | Freq: Once | ORAL | Status: AC
  Administered 2024-05-02: 324 mg via ORAL
  Filled 2024-05-02: qty 4

## 2024-05-02 MED ORDER — LORAZEPAM 2 MG/ML IJ SOLN
0.0000 mg | INTRAMUSCULAR | Status: DC
  Administered 2024-05-03: 1 mg via INTRAVENOUS
  Filled 2024-05-02 (×2): qty 1

## 2024-05-02 MED ORDER — ONDANSETRON HCL 4 MG PO TABS
4.0000 mg | ORAL_TABLET | Freq: Four times a day (QID) | ORAL | Status: AC | PRN
Start: 2024-05-02 — End: ?

## 2024-05-02 MED ORDER — MIDAZOLAM HCL 2 MG/2ML IJ SOLN
2.0000 mg | Freq: Once | INTRAMUSCULAR | Status: AC
  Administered 2024-05-02: 2 mg via INTRAVENOUS
  Filled 2024-05-02: qty 2

## 2024-05-02 MED ORDER — INSULIN ASPART 100 UNIT/ML IJ SOLN
0.0000 [IU] | Freq: Three times a day (TID) | INTRAMUSCULAR | Status: DC
  Administered 2024-05-04: 1 [IU] via SUBCUTANEOUS

## 2024-05-02 MED ORDER — FOLIC ACID 1 MG PO TABS
1.0000 mg | ORAL_TABLET | Freq: Every day | ORAL | Status: DC
  Administered 2024-05-03 – 2024-05-04 (×2): 1 mg via ORAL
  Filled 2024-05-02 (×2): qty 1

## 2024-05-02 MED ORDER — SENNOSIDES-DOCUSATE SODIUM 8.6-50 MG PO TABS: 1.0000 | ORAL_TABLET | Freq: Every evening | ORAL | Status: DC | PRN

## 2024-05-02 MED ORDER — ADULT MULTIVITAMIN W/MINERALS CH
1.0000 | ORAL_TABLET | Freq: Every day | ORAL | Status: DC
  Administered 2024-05-03 – 2024-05-04 (×2): 1 via ORAL
  Filled 2024-05-02 (×2): qty 1

## 2024-05-02 MED ORDER — CHLORDIAZEPOXIDE HCL 25 MG PO CAPS
100.0000 mg | ORAL_CAPSULE | Freq: Once | ORAL | Status: AC
  Administered 2024-05-02: 100 mg via ORAL
  Filled 2024-05-02: qty 4

## 2024-05-02 MED ORDER — THIAMINE MONONITRATE 100 MG PO TABS
100.0000 mg | ORAL_TABLET | Freq: Every day | ORAL | Status: AC
Start: 2024-05-03 — End: ?
  Administered 2024-05-03 – 2024-05-04 (×2): 100 mg via ORAL
  Filled 2024-05-02 (×2): qty 1

## 2024-05-02 MED ORDER — ACETAMINOPHEN 325 MG PO TABS
650.0000 mg | ORAL_TABLET | Freq: Four times a day (QID) | ORAL | Status: DC | PRN
  Administered 2024-05-03 – 2024-05-04 (×5): 650 mg via ORAL
  Filled 2024-05-02 (×5): qty 2

## 2024-05-02 MED ORDER — ADULT MULTIVITAMIN W/MINERALS CH
1.0000 | ORAL_TABLET | Freq: Once | ORAL | Status: AC
  Administered 2024-05-02: 1 via ORAL
  Filled 2024-05-02: qty 1

## 2024-05-02 MED ORDER — LACTATED RINGERS IV BOLUS
1000.0000 mL | Freq: Once | INTRAVENOUS | Status: AC
  Administered 2024-05-02: 1000 mL via INTRAVENOUS

## 2024-05-02 MED ORDER — LORAZEPAM 2 MG/ML IJ SOLN: 1.0000 mg | INTRAMUSCULAR | Status: DC | PRN

## 2024-05-02 MED ORDER — ACETAMINOPHEN 650 MG RE SUPP: 650.0000 mg | Freq: Four times a day (QID) | RECTAL | Status: DC | PRN

## 2024-05-02 MED ORDER — LORAZEPAM 1 MG PO TABS: 1.0000 mg | ORAL_TABLET | Freq: Once | ORAL | Status: DC

## 2024-05-02 MED ORDER — ENOXAPARIN SODIUM 40 MG/0.4ML IJ SOSY
40.0000 mg | PREFILLED_SYRINGE | Freq: Every day | INTRAMUSCULAR | Status: AC
Start: 2024-05-03 — End: ?
  Administered 2024-05-03 – 2024-05-04 (×2): 40 mg via SUBCUTANEOUS
  Filled 2024-05-02 (×2): qty 0.4

## 2024-05-02 MED ORDER — SODIUM CHLORIDE 0.9% FLUSH
3.0000 mL | Freq: Two times a day (BID) | INTRAVENOUS | Status: DC
  Administered 2024-05-03 – 2024-05-04 (×4): 3 mL via INTRAVENOUS

## 2024-05-02 MED ORDER — LORAZEPAM 1 MG PO TABS
1.0000 mg | ORAL_TABLET | ORAL | Status: DC | PRN
  Administered 2024-05-04: 2 mg via ORAL
  Filled 2024-05-02: qty 2

## 2024-05-02 MED ORDER — SODIUM CHLORIDE 0.9 % IV SOLN: INTRAVENOUS | Status: AC

## 2024-05-02 MED ORDER — THIAMINE HCL 100 MG/ML IJ SOLN
100.0000 mg | Freq: Once | INTRAMUSCULAR | Status: AC
  Administered 2024-05-02: 100 mg via INTRAMUSCULAR
  Filled 2024-05-02: qty 2

## 2024-05-02 MED ORDER — ATORVASTATIN CALCIUM 10 MG PO TABS
20.0000 mg | ORAL_TABLET | Freq: Every day | ORAL | Status: DC
  Administered 2024-05-03: 20 mg via ORAL
  Filled 2024-05-02: qty 2

## 2024-05-02 MED ORDER — INSULIN ASPART 100 UNIT/ML IJ SOLN: 0.0000 [IU] | Freq: Every day | INTRAMUSCULAR | Status: DC

## 2024-05-02 MED ORDER — SODIUM CHLORIDE 0.9 % IV BOLUS
1000.0000 mL | Freq: Once | INTRAVENOUS | Status: AC
  Administered 2024-05-02: 1000 mL via INTRAVENOUS

## 2024-05-02 MED ORDER — LORAZEPAM 1 MG PO TABS
2.0000 mg | ORAL_TABLET | Freq: Once | ORAL | Status: AC
  Administered 2024-05-02: 2 mg via ORAL
  Filled 2024-05-02: qty 2

## 2024-05-02 MED ORDER — THIAMINE HCL 100 MG/ML IJ SOLN: 100.0000 mg | Freq: Every day | INTRAMUSCULAR | Status: DC

## 2024-05-02 MED ORDER — ASPIRIN 81 MG PO TBEC
81.0000 mg | DELAYED_RELEASE_TABLET | Freq: Every day | ORAL | Status: DC
  Administered 2024-05-03 – 2024-05-04 (×2): 81 mg via ORAL

## 2024-05-02 MED ORDER — LORAZEPAM 2 MG/ML IJ SOLN: 0.0000 mg | Freq: Three times a day (TID) | INTRAMUSCULAR | Status: DC

## 2024-05-02 MED ORDER — MAGNESIUM SULFATE 2 GM/50ML IV SOLN
2.0000 g | Freq: Once | INTRAVENOUS | Status: AC
  Administered 2024-05-02: 2 g via INTRAVENOUS
  Filled 2024-05-02: qty 50

## 2024-05-02 MED ORDER — ONDANSETRON HCL 4 MG/2ML IJ SOLN: 4.0000 mg | Freq: Four times a day (QID) | INTRAMUSCULAR | Status: DC | PRN

## 2024-05-02 NOTE — ED Triage Notes (Signed)
 Pt arrived from home via POV c/o chest pain 10/10 described as pressure that won't let him breathe began on 05/01/2024.

## 2024-05-02 NOTE — H&P (Incomplete)
 History and Physical    Bobby Thompson OVF:[id number] DOB: 11/27/1975 DOA: 05/02/2024  PCP: Patient, No Pcp Per   Patient coming from: Home   Chief Complaint: Chest pain   HPI: Bobby Thompson is a 48 y.o. male with medical history significant for type 2 diabetes mellitus, hyperlipidemia, CVA, anxiety, alcohol abuse, and cocaine abuse who presents with chest pain.  Patient has been trying to cut back on his alcohol use for the past 2 weeks with goal of quitting.  His last drink was 3 days ago.  Since then, he has developed chest discomfort with intermittent shortness of breath, nausea, and diaphoresis.  Chest discomfort does not worsen with activity and he is unable to identify any alleviating or exacerbating factors.  He has had a cough associated with this but no sputum production, fever, or chills.  He denies any lower extremity swelling or tenderness.  ED Course: Upon arrival to the ED, patient is found to be afebrile and saturating well on room air with elevated heart rate and stable BP.  Labs are most notable for sodium 129, normal creatinine, WBC 11,500, undetectable ethanol, magnesium 1.6, troponin 18, and normal BNP.  EKG demonstrates sinus tachycardia with rate 134.  Chest x-ray is normal.  Patient was treated in the ED with 2 L of IV fluid, aspirin , Ativan, Versed , Librium, vitamins, and IV magnesium.  Review of Systems:  All other systems reviewed and apart from HPI, are negative.  Past Medical History:  Diagnosis Date  . Anxiety    afraid of heights, different fears have surfaced since stroke   . Complication of anesthesia   . Diabetes mellitus without complication [facility]) March 2016  . Hyperlipidemia March 2016  . PONV (postoperative nausea and vomiting)   . Stroke [facility])     Past Surgical History:  Procedure Laterality Date  . PILONIDAL CYST EXCISION    . RADIOLOGY WITH ANESTHESIA N/A 07/13/2015   Procedure: RADIOLOGY WITH ANESTHESIA;  Surgeon: Luellen Sages,  MD;  Location: [facility] OR;  Service: Radiology;  Laterality: N/A;    Social History:   reports that he quit smoking about 9 years ago. He has never used smokeless tobacco. He reports current alcohol use of about 6.0 standard drinks of alcohol per week. He reports that he does not use drugs.  No Known Allergies  Family History  Problem Relation Age of Onset  . Diabetes Father      Prior to Admission medications   Medication Sig Start Date End Date Taking? Authorizing Provider  aspirin  EC 81 MG EC tablet Take 1 tablet (81 mg total) by mouth daily. 02/08/15   Emokpae, Ejiroghene E, MD  atorvastatin  (LIPITOR) 20 MG tablet Take 1 tablet (20 mg total) by mouth daily at 6 PM. Must have office visit for refills 05/29/16   Funches, Josalyn, MD  Blood Glucose Monitoring Suppl (TRUE METRIX METER) W/DEVICE KIT 1 each by Does not apply route as needed. 05/19/15   Funches, Josalyn, MD  glucose blood (TRUE METRIX BLOOD GLUCOSE TEST) test strip 1 each by Other route 3 (three) times daily. 05/19/15   Funches, Josalyn, MD  metFORMIN  (GLUCOPHAGE ) 500 MG tablet Take 1 tablet (500 mg total) by mouth daily with breakfast. Must have office visit for refills 05/29/16   Funches, Josalyn, MD  nicotine  polacrilex (NICORETTE) 4 MG gum Take 4 mg by mouth 3 (three) times daily as needed for smoking cessation.    [provider]  TRUEPLUS LANCETS 28G MISC 1 each by Does  not apply route 3 (three) times daily. 05/19/15   Funches, Josalyn, MD    Physical Exam: Vitals:   05/02/24 2130 05/02/24 2215 05/02/24 2230 05/02/24 2245  BP: 138/81 127/77 127/86 132/76  Pulse:  (!) 113 90 (!) 108  Resp:  (!) 24 (!) 28 (!) 29  Temp:    100 F (37.8 C)  TempSrc:    Oral  SpO2: 93% 97% 100% 97%  Weight:      Height:         Constitutional: NAD, calm  Eyes: PERTLA, lids and conjunctivae normal ENMT: Mucous membranes are moist. Posterior pharynx clear of any exudate or lesions.   Neck: supple, no masses  Respiratory: clear to  auscultation bilaterally, no wheezing, no crackles. No accessory muscle use.  Cardiovascular: S1 & S2 heard, regular rate and rhythm. No extremity edema. No significant JVD. Abdomen: No distension, no tenderness, soft. Bowel sounds active.  Musculoskeletal: no clubbing / cyanosis. No joint deformity upper and lower extremities.   Skin: no significant rashes, lesions, ulcers. Warm, dry, well-perfused. Neurologic: CN 2-12 grossly intact. Sensation intact, DTR normal. Strength 5/5 in all 4 limbs. Alert and oriented.  Psychiatric: Pleasant. Cooperative.    Labs and Imaging on Admission: I have personally reviewed following labs and imaging studies  CBC: Recent Labs  Lab 05/02/24 1946  WBC 11.5*  HGB 14.9  HCT 42.5  MCV 90.6  PLT 233   Basic Metabolic Panel: Recent Labs  Lab 05/02/24 1946 05/02/24 2200  NA 129*  --   K 3.7  --   CL 95*  --   CO2 21*  --   GLUCOSE 136*  --   BUN 14  --   CREATININE 0.80  --   CALCIUM  8.4*  --   MG  --  1.6*   GFR: Estimated Creatinine Clearance: 123.9 mL/min (by C-G formula based on SCr of 0.8 mg/dL). Liver Function Tests: No results for input(s): AST, ALT, ALKPHOS, BILITOT, PROT, ALBUMIN in the last 168 hours. No results for input(s): LIPASE, AMYLASE in the last 168 hours. No results for input(s): AMMONIA in the last 168 hours. Coagulation Profile: No results for input(s): INR, PROTIME in the last 168 hours. Cardiac Enzymes: No results for input(s): CKTOTAL, CKMB, CKMBINDEX, TROPONINI in the last 168 hours. BNP (last 3 results) No results for input(s): PROBNP in the last 8760 hours. HbA1C: No results for input(s): HGBA1C in the last 72 hours. CBG: Recent Labs  Lab 05/02/24 2006  GLUCAP 137*   Lipid Profile: No results for input(s): CHOL, HDL, LDLCALC, TRIG, CHOLHDL, LDLDIRECT in the last 72 hours. Thyroid Function Tests: No results for input(s): TSH, T4TOTAL, FREET4, T3FREE,  THYROIDAB in the last 72 hours. Anemia Panel: No results for input(s): VITAMINB12, FOLATE, FERRITIN, TIBC, IRON, RETICCTPCT in the last 72 hours. Urine analysis: No results found for: COLORURINE, APPEARANCEUR, LABSPEC, PHURINE, GLUCOSEU, HGBUR, BILIRUBINUR, KETONESUR, PROTEINUR, UROBILINOGEN, NITRITE, LEUKOCYTESUR Sepsis Labs: @LABRCNTIP (procalcitonin:4,lacticidven:4) )No results found for this or any previous visit (from the past 240 hours).   Radiological Exams on Admission: DG Chest 2 View Result Date: 05/02/2024 CLINICAL DATA:  Chest pain, shortness of breath EXAM: CHEST - 2 VIEW COMPARISON:  None Available. FINDINGS: Lungs are clear.  No pleural effusion or pneumothorax. The heart is normal in size. Moderate degenerative changes of the visualized thoracolumbar spine. IMPRESSION: Normal chest radiographs. Electronically Signed   By: Zadie Herter M.D.   On: 05/02/2024 20:30    EKG: Independently reviewed. Sinus tachycardia, rate  134.   Assessment/Plan   1. Alcohol withdrawal  - Continue CIWA scoring, treatment with Ativan, supplement vitamins, consult TOC    2. Chest pain  - ***  - Check d-dimer, lipase, and LFTs, continue cardiac monitoring    3. Type II DM  - Check CBGs and use low-intensity SSI for now    4. Hyponatremia  - Serum sodium 129 on admission; he appears euvolemic  - Continue NS infusion, restrict free-water intake, repeat chem panel in am   5. Hx of CVA  - Resume ASA and Lipitor    DVT prophylaxis: Lovenox   Code Status: Full  Level of Care: Level of care: Progressive Family Communication: Significant other at bedside   Disposition Plan:  Patient is from: home  Anticipated d/c is to: home  Anticipated d/c date is: 6/16 or 05/04/24  Patient currently: Pending treatment of alcohol withdrawal, d-dimer, cardiac monitoring  Consults called: None  Admission status: Observation     Walton Guppy, MD [facility]  05/02/2024, 11:43 PM

## 2024-05-02 NOTE — ED Provider Notes (Signed)
[facility] EMERGENCY DEPARTMENT AT [facility] Provider Note   CSN: [id number] Arrival date & time: 05/02/24  1932     History Chief Complaint  Patient presents with   Chest Pain   Shortness of Breath    Bobby Thompson is a 48 y.o. male w/ PMHx CVA type 2 diabetes dyslipidemia alcohol use, substance use, anxiety who presents to the ED for evaluation of chest pain.  Patient reports he has had gradually worsening chest pain for the past 3 days.  He reports pain is across his entire chest.  No radiation of pain.  He reports mild nausea this morning.  He reports intermittent shortness of breath.  He is diaphoretic today.  He reports intermittent hot flashes and chills.  Patient did not take his temperature.  He denies any URI symptoms.  Patient states until 2 weeks ago he drink alcohol every day.  He reports for the past 2 weeks he has drank intermittently last drink 3 days ago.  He states he last used cocaine 15 days ago.    Physical Exam Updated Vital Signs BP 132/76   Pulse (!) 108   Temp 100 F (37.8 C) (Oral)   Resp (!) 29   Ht 5' 7 (1.702 m)   Wt 90.7 kg   SpO2 97%   BMI 31.32 kg/m  Physical Exam Vitals and nursing note reviewed.  Constitutional:      General: He is not in acute distress.    Appearance: He is well-developed. He is obese. He is diaphoretic.  HENT:     Head: Normocephalic and atraumatic.   Eyes:     Extraocular Movements: Extraocular movements intact.     Conjunctiva/sclera: Conjunctivae normal.     Pupils: Pupils are equal, round, and reactive to light.    Cardiovascular:     Rate and Rhythm: Regular rhythm. Tachycardia present.     Pulses:          Carotid pulses are 2+ on the right side and 2+ on the left side.      Dorsalis pedis pulses are 2+ on the right side and 2+ on the left side.     Heart sounds: Normal heart sounds. No murmur heard. Pulmonary:     Effort: Pulmonary effort is normal. No respiratory distress.     Breath  sounds: Normal breath sounds. No wheezing or rales.  Abdominal:     Palpations: Abdomen is soft.     Tenderness: There is no abdominal tenderness.   Musculoskeletal:        General: No swelling.     Right lower leg: No edema.     Left lower leg: No edema.   Skin:    General: Skin is warm.     Capillary Refill: Capillary refill takes less than 2 seconds.   Neurological:     Mental Status: He is alert.   Psychiatric:        Mood and Affect: Mood is anxious.     ED Results / Procedures / Treatments   Labs (all labs ordered are listed, but only abnormal results are displayed) Labs Reviewed  BASIC METABOLIC PANEL WITH GFR - Abnormal; Notable for the following components:      Result Value   Sodium 129 (*)    Chloride 95 (*)    CO2 21 (*)    Glucose, Bld 136 (*)    Calcium  8.4 (*)    All other components within normal limits  CBC - Abnormal;  Notable for the following components:   WBC 11.5 (*)    All other components within normal limits  RAPID URINE DRUG SCREEN, HOSP PERFORMED - Abnormal; Notable for the following components:   Tetrahydrocannabinol POSITIVE (*)    All other components within normal limits  MAGNESIUM - Abnormal; Notable for the following components:   Magnesium 1.6 (*)    All other components within normal limits  CBG MONITORING, ED - Abnormal; Notable for the following components:   Glucose-Capillary 137 (*)    All other components within normal limits  TROPONIN I (HIGH SENSITIVITY) - Abnormal; Notable for the following components:   Troponin I (High Sensitivity) 18 (*)    All other components within normal limits  TROPONIN I (HIGH SENSITIVITY) - Abnormal; Notable for the following components:   Troponin I (High Sensitivity) 20 (*)    All other components within normal limits  ETHANOL  BRAIN NATRIURETIC PEPTIDE    EKG None  Radiology DG Chest 2 View Result Date: 05/02/2024 CLINICAL DATA:  Chest pain, shortness of breath EXAM: CHEST - 2 VIEW  COMPARISON:  None Available. FINDINGS: Lungs are clear.  No pleural effusion or pneumothorax. The heart is normal in size. Moderate degenerative changes of the visualized thoracolumbar spine. IMPRESSION: Normal chest radiographs. Electronically Signed   By: Zadie Herter M.D.   On: 05/02/2024 20:30    Medications Ordered in ED Medications  magnesium sulfate IVPB 2 g 50 mL (has no administration in time range)  aspirin  chewable tablet 324 mg (324 mg Oral Given 05/02/24 2025)  lactated ringers  bolus 1,000 mL (0 mLs Intravenous Stopped 05/02/24 2251)  LORazepam (ATIVAN) tablet 2 mg (2 mg Oral Given 05/02/24 2025)  chlordiazePOXIDE (LIBRIUM) capsule 100 mg (100 mg Oral Given 05/02/24 2024)  thiamine (VITAMIN B1) injection 100 mg (100 mg Intramuscular Given 05/02/24 2027)  multivitamin with minerals tablet 1 tablet (1 tablet Oral Given 05/02/24 2025)  sodium chloride  0.9 % bolus 1,000 mL (1,000 mLs Intravenous New Bag/Given 05/02/24 2220)  midazolam  (VERSED ) injection 2 mg (2 mg Intravenous Given 05/02/24 2252)    ED Course/ Medical Decision Making/ A&P  Bobby Thompson is a 48 y.o. male presents as detailed above  Differential ddx: Alcohol withdrawal, substance use, ACS, costochondritis, pneumonia, effusion, new heart failure, substance withdrawal, electrolyte abnormalities, dehydration, AKI,  On arrival, patient tachycardic afebrile hemodynamically stable no hypoxia or respiratory distress.   ED Work-up: Please see details of labs and imaging listed above. Due to concern for alcohol withdrawal patient received Ativan Librium thiamine and multivitamin.  Patient also received aspirin  324 and liter fluid bolus. CIWA 7-8 UDS positive for THC.  No elevation of BNP.  Ethanol less than 15.  Glucose within normal limits. No significant abnormality on chest x-ray. Mild leukocytosis 11.5 on CBC which is nonspecific. Patient found to have hyponatremia 129, hypochloremia 95, Initial troponin 18,  repeat 20 Tachycardia improved but not resolved.  Second liter of fluid bolus initiated.  Temperature is gradually rising.  Blood pressure remained stable.  Patient remains tachypneic.  Patient receive Versed .  Due to alcohol withdrawal hyponatremia persistent tachycardia patient would benefit from admission. Patient will be admitted for further evaluation and treatment.   Patient seen with supervising physician who agrees with plan.  Final Clinical Impression(s) / ED Diagnoses Final diagnoses:  Alcohol withdrawal syndrome without complication (HCC)  Hyponatremia     Angele Keller, DO PGY-3 Emergency Medicine    Angele Keller, DO 05/02/24 2337    Albertus Hughs, DO  05/03/24 2319  

## 2024-05-02 NOTE — ED Notes (Signed)
 Holding versed  until LR is finished running due to IV incompatibility

## 2024-05-02 NOTE — H&P (Incomplete)
 History and Physical    Bobby Thompson ZOX:[id number] DOB: 28-Jan-1976 DOA: 05/02/2024  PCP: Patient, No Pcp Per   Patient coming from: Home   Chief Complaint: Chest pain   HPI: Bobby Thompson is a 48 y.o. male with medical history significant for type 2 diabetes mellitus, hyperlipidemia, CVA, anxiety, alcohol abuse, and cocaine abuse who presents with chest pain.  Patient has been trying to cut back on his alcohol use for the past 2 weeks with goal of quitting.  His last drink was 3 days ago.  Since then, he has developed chest discomfort with intermittent shortness of breath, nausea, and diaphoresis.  Chest discomfort does not worsen with activity and he is unable to identify any alleviating or exacerbating factors.  He has had a cough associated with this but no sputum production, fever, or chills.  He denies any lower extremity swelling or tenderness.  ED Course: Upon arrival to the ED, patient is found to be afebrile and saturating well on room air with elevated heart rate and stable BP.  Labs are most notable for sodium 129, normal creatinine, WBC 11,500, undetectable ethanol, magnesium 1.6, troponin 18, and normal BNP.  EKG demonstrates sinus tachycardia with rate 134.  Chest x-ray is normal.  Patient was treated in the ED with 2 L of IV fluid, aspirin , Ativan, Versed , Librium, vitamins, and IV magnesium.  Review of Systems:  All other systems reviewed and apart from HPI, are negative.  Past Medical History:  Diagnosis Date   Anxiety    afraid of heights, different fears have surfaced since stroke    Complication of anesthesia    Diabetes mellitus without complication [facility]) March 2016   Hyperlipidemia March 2016   PONV (postoperative nausea and vomiting)    Stroke [facility])     Past Surgical History:  Procedure Laterality Date   PILONIDAL CYST EXCISION     RADIOLOGY WITH ANESTHESIA N/A 07/13/2015   Procedure: RADIOLOGY WITH ANESTHESIA;  Surgeon: Luellen Sages, MD;   Location: [facility] OR;  Service: Radiology;  Laterality: N/A;    Social History:   reports that he quit smoking about 9 years ago. He has never used smokeless tobacco. He reports current alcohol use of about 6.0 standard drinks of alcohol per week. He reports that he does not use drugs.  No Known Allergies  Family History  Problem Relation Age of Onset   Diabetes Father      Prior to Admission medications   Medication Sig Start Date End Date Taking? Authorizing Provider  aspirin  EC 81 MG EC tablet Take 1 tablet (81 mg total) by mouth daily. 02/08/15   Emokpae, Ejiroghene E, MD  atorvastatin  (LIPITOR) 20 MG tablet Take 1 tablet (20 mg total) by mouth daily at 6 PM. Must have office visit for refills 05/29/16   Funches, Josalyn, MD  Blood Glucose Monitoring Suppl (TRUE METRIX METER) W/DEVICE KIT 1 each by Does not apply route as needed. 05/19/15   Funches, Josalyn, MD  glucose blood (TRUE METRIX BLOOD GLUCOSE TEST) test strip 1 each by Other route 3 (three) times daily. 05/19/15   Funches, Josalyn, MD  metFORMIN  (GLUCOPHAGE ) 500 MG tablet Take 1 tablet (500 mg total) by mouth daily with breakfast. Must have office visit for refills 05/29/16   Funches, Josalyn, MD  nicotine  polacrilex (NICORETTE) 4 MG gum Take 4 mg by mouth 3 (three) times daily as needed for smoking cessation.    [provider]  TRUEPLUS LANCETS 28G MISC 1 each by Does  not apply route 3 (three) times daily. 05/19/15   Funches, Josalyn, MD    Physical Exam: Vitals:   05/02/24 2130 05/02/24 2215 05/02/24 2230 05/02/24 2245  BP: 138/81 127/77 127/86 132/76  Pulse:  (!) 113 90 (!) 108  Resp:  (!) 24 (!) 28 (!) 29  Temp:    100 F (37.8 C)  TempSrc:    Oral  SpO2: 93% 97% 100% 97%  Weight:      Height:        Constitutional: NAD, sweaty brow  Eyes: PERTLA, lids and conjunctivae normal ENMT: Mucous membranes are moist. Posterior pharynx clear of any exudate or lesions.   Neck: supple, no masses  Respiratory: no  wheezing, no crackles. No accessory muscle use.  Cardiovascular: Rate ~120 and regular. No extremity edema.   Abdomen: No tenderness, soft. Bowel sounds active.  Musculoskeletal: no clubbing / cyanosis. No joint deformity upper and lower extremities.   Skin: no significant rashes, lesions, ulcers. Warm, dry, well-perfused. Neurologic: CN 2-12 grossly intact. Moving all extremities. Alert and oriented.  Psychiatric: Calm. Cooperative.    Labs and Imaging on Admission: I have personally reviewed following labs and imaging studies  CBC: Recent Labs  Lab 05/02/24 1946  WBC 11.5*  HGB 14.9  HCT 42.5  MCV 90.6  PLT 233   Basic Metabolic Panel: Recent Labs  Lab 05/02/24 1946 05/02/24 2200  NA 129*  --   K 3.7  --   CL 95*  --   CO2 21*  --   GLUCOSE 136*  --   BUN 14  --   CREATININE 0.80  --   CALCIUM  8.4*  --   MG  --  1.6*   GFR: Estimated Creatinine Clearance: 123.9 mL/min (by C-G formula based on SCr of 0.8 mg/dL). Liver Function Tests: No results for input(s): AST, ALT, ALKPHOS, BILITOT, PROT, ALBUMIN in the last 168 hours. No results for input(s): LIPASE, AMYLASE in the last 168 hours. No results for input(s): AMMONIA in the last 168 hours. Coagulation Profile: No results for input(s): INR, PROTIME in the last 168 hours. Cardiac Enzymes: No results for input(s): CKTOTAL, CKMB, CKMBINDEX, TROPONINI in the last 168 hours. BNP (last 3 results) No results for input(s): PROBNP in the last 8760 hours. HbA1C: No results for input(s): HGBA1C in the last 72 hours. CBG: Recent Labs  Lab 05/02/24 2006  GLUCAP 137*   Lipid Profile: No results for input(s): CHOL, HDL, LDLCALC, TRIG, CHOLHDL, LDLDIRECT in the last 72 hours. Thyroid Function Tests: No results for input(s): TSH, T4TOTAL, FREET4, T3FREE, THYROIDAB in the last 72 hours. Anemia Panel: No results for input(s): VITAMINB12, FOLATE, FERRITIN, TIBC,  IRON, RETICCTPCT in the last 72 hours. Urine analysis: No results found for: COLORURINE, APPEARANCEUR, LABSPEC, PHURINE, GLUCOSEU, HGBUR, BILIRUBINUR, KETONESUR, PROTEINUR, UROBILINOGEN, NITRITE, LEUKOCYTESUR Sepsis Labs: @LABRCNTIP (procalcitonin:4,lacticidven:4) )No results found for this or any previous visit (from the past 240 hours).   Radiological Exams on Admission: DG Chest 2 View Result Date: 05/02/2024 CLINICAL DATA:  Chest pain, shortness of breath EXAM: CHEST - 2 VIEW COMPARISON:  None Available. FINDINGS: Lungs are clear.  No pleural effusion or pneumothorax. The heart is normal in size. Moderate degenerative changes of the visualized thoracolumbar spine. IMPRESSION: Normal chest radiographs. Electronically Signed   By: Zadie Herter M.D.   On: 05/02/2024 20:30    EKG: Independently reviewed. Sinus tachycardia, rate 134.   Assessment/Plan   1. Alcohol withdrawal  - Continue CIWA scoring, treatment with Ativan, supplement  vitamins, consult TOC    2. Chest pain  - Pain is atypical, he is persistently tachycardic and has SOB, non-productive cough, nausea, diaphoresis, and anxiety  - No acute ischemic features noted on EKG, CXR is normal, and troponin is minimally elevated and flat   - Check d-dimer, lipase, and LFTs, continue cardiac monitoring    Addendum (03:05): D-dimer was elevated and CTA was ordered. Patient developed fever and blood cultures were ordered. CTA reveals pneumonia, will check strep pneumo and legionella antigens, start Rocephin and azithromycin.    3. Type II DM  - Check CBGs and use low-intensity SSI for now    4. Hyponatremia  - Serum sodium 129 on admission; he appears euvolemic  - Check urine sodium and urine osm, continue NS infusion, restrict free-water intake, repeat chem panel in am   5. Hx of CVA  - Resume ASA and Lipitor    DVT prophylaxis: Lovenox   Code Status: Full  Level of Care: Level of care:  Progressive Family Communication: Significant other at bedside   Disposition Plan:  Patient is from: home  Anticipated d/c is to: home  Anticipated d/c date is: 6/16 or 05/04/24  Patient currently: Pending treatment of alcohol withdrawal, d-dimer, cardiac monitoring  Consults called: None  Admission status: Observation     Walton Guppy, MD [facility]  05/02/2024, 11:43 PM

## 2024-05-03 ENCOUNTER — Observation Stay (HOSPITAL_COMMUNITY): Payer: Self-pay

## 2024-05-03 DIAGNOSIS — J189 Pneumonia, unspecified organism: Secondary | ICD-10-CM | POA: Diagnosis present

## 2024-05-03 LAB — TSH: TSH: 2.669 u[IU]/mL (ref 0.350–4.500)

## 2024-05-03 LAB — CBC
HCT: 36.2 % — ABNORMAL LOW (ref 39.0–52.0)
Hemoglobin: 12.6 g/dL — ABNORMAL LOW (ref 13.0–17.0)
MCH: 31.5 pg (ref 26.0–34.0)
MCHC: 34.8 g/dL (ref 30.0–36.0)
MCV: 90.5 fL (ref 80.0–100.0)
Platelets: 219 10*3/uL (ref 150–400)
RBC: 4 MIL/uL — ABNORMAL LOW (ref 4.22–5.81)
RDW: 12.4 % (ref 11.5–15.5)
WBC: 10.8 10*3/uL — ABNORMAL HIGH (ref 4.0–10.5)
nRBC: 0 % (ref 0.0–0.2)

## 2024-05-03 LAB — URINALYSIS, ROUTINE W REFLEX MICROSCOPIC
Bilirubin Urine: NEGATIVE
Glucose, UA: 100 mg/dL — AB
Hgb urine dipstick: NEGATIVE
Ketones, ur: NEGATIVE mg/dL
Leukocytes,Ua: NEGATIVE
Nitrite: NEGATIVE
Protein, ur: NEGATIVE mg/dL
Specific Gravity, Urine: 1.01 (ref 1.005–1.030)
pH: 6 (ref 5.0–8.0)

## 2024-05-03 LAB — CBG MONITORING, ED
Glucose-Capillary: 101 mg/dL — ABNORMAL HIGH (ref 70–99)
Glucose-Capillary: 111 mg/dL — ABNORMAL HIGH (ref 70–99)
Glucose-Capillary: 141 mg/dL — ABNORMAL HIGH (ref 70–99)
Glucose-Capillary: 99 mg/dL (ref 70–99)

## 2024-05-03 LAB — RESPIRATORY PANEL BY PCR

## 2024-05-03 LAB — COMPREHENSIVE METABOLIC PANEL WITH GFR
ALT: 29 U/L (ref 0–44)
AST: 48 U/L — ABNORMAL HIGH (ref 15–41)
Albumin: 2.5 g/dL — ABNORMAL LOW (ref 3.5–5.0)
Alkaline Phosphatase: 68 U/L (ref 38–126)
Anion gap: 9 (ref 5–15)
BUN: 10 mg/dL (ref 6–20)
CO2: 22 mmol/L (ref 22–32)
Calcium: 7.5 mg/dL — ABNORMAL LOW (ref 8.9–10.3)
Chloride: 95 mmol/L — ABNORMAL LOW (ref 98–111)
Creatinine, Ser: 0.8 mg/dL (ref 0.61–1.24)
GFR, Estimated: >60 mL/min (ref >60)
Glucose, Bld: 112 mg/dL — ABNORMAL HIGH (ref 70–99)
Potassium: 3.5 mmol/L (ref 3.5–5.1)
Sodium: 126 mmol/L — ABNORMAL LOW (ref 135–145)
Total Bilirubin: 0.6 mg/dL (ref 0.0–1.2)
Total Protein: 5.9 g/dL — ABNORMAL LOW (ref 6.5–8.1)

## 2024-05-03 LAB — HEMOGLOBIN A1C
Hgb A1c MFr Bld: 6.3 % — ABNORMAL HIGH (ref 4.8–5.6)
Mean Plasma Glucose: 134.11 mg/dL

## 2024-05-03 LAB — STREP PNEUMONIAE URINARY ANTIGEN: Strep Pneumo Urinary Antigen: NEGATIVE

## 2024-05-03 LAB — OSMOLALITY, URINE: Osmolality, Ur: 760 mosm/kg (ref 300–900)

## 2024-05-03 LAB — CORTISOL-AM, BLOOD: Cortisol - AM: 21.3 ug/dL (ref 6.7–22.6)

## 2024-05-03 LAB — SODIUM, URINE, RANDOM: Sodium, Ur: 142 mmol/L

## 2024-05-03 LAB — LIPASE, BLOOD: Lipase: 41 U/L (ref 11–51)

## 2024-05-03 LAB — GLUCOSE, CAPILLARY: Glucose-Capillary: 101 mg/dL — ABNORMAL HIGH (ref 70–99)

## 2024-05-03 LAB — HIV ANTIBODY (ROUTINE TESTING W REFLEX): HIV Screen 4th Generation wRfx: NONREACTIVE

## 2024-05-03 LAB — D-DIMER, QUANTITATIVE: D-Dimer, Quant: 3.48 ug{FEU}/mL — ABNORMAL HIGH (ref 0.00–0.50)

## 2024-05-03 LAB — SARS CORONAVIRUS 2 BY RT PCR: SARS Coronavirus 2 by RT PCR: NEGATIVE

## 2024-05-03 MED ORDER — METRONIDAZOLE 500 MG PO TABS
500.0000 mg | ORAL_TABLET | Freq: Two times a day (BID) | ORAL | Status: DC
  Administered 2024-05-03 – 2024-05-04 (×3): 500 mg via ORAL
  Filled 2024-05-03 (×4): qty 1

## 2024-05-03 MED ORDER — AZITHROMYCIN 500 MG PO TABS
500.0000 mg | ORAL_TABLET | Freq: Every day | ORAL | Status: DC
  Administered 2024-05-04: 500 mg via ORAL
  Filled 2024-05-03: qty 1

## 2024-05-03 MED ORDER — SODIUM CHLORIDE 0.9 % IV SOLN
500.0000 mg | Freq: Every day | INTRAVENOUS | Status: DC
  Administered 2024-05-03: 500 mg via INTRAVENOUS
  Filled 2024-05-03: qty 5

## 2024-05-03 MED ORDER — SODIUM CHLORIDE 0.9 % IV SOLN
2.0000 g | Freq: Every day | INTRAVENOUS | Status: DC
  Administered 2024-05-03 (×2): 2 g via INTRAVENOUS
  Filled 2024-05-03 (×2): qty 20

## 2024-05-03 MED ORDER — GUAIFENESIN-DM 100-10 MG/5ML PO SYRP
5.0000 mL | ORAL_SOLUTION | ORAL | Status: DC | PRN
  Administered 2024-05-04 (×2): 5 mL via ORAL
  Filled 2024-05-03 (×2): qty 5

## 2024-05-03 MED ORDER — NICOTINE 7 MG/24HR TD PT24
7.0000 mg | MEDICATED_PATCH | Freq: Every day | TRANSDERMAL | Status: DC
  Administered 2024-05-03 – 2024-05-04 (×2): 7 mg via TRANSDERMAL
  Filled 2024-05-03 (×2): qty 1

## 2024-05-03 MED ORDER — IOHEXOL 350 MG/ML SOLN
75.0000 mL | Freq: Once | INTRAVENOUS | Status: AC | PRN
  Administered 2024-05-03: 75 mL via INTRAVENOUS

## 2024-05-03 MED ORDER — OXYCODONE HCL 5 MG PO TABS: 5.0000 mg | ORAL_TABLET | ORAL | Status: DC | PRN

## 2024-05-03 NOTE — TOC CM/SW Note (Signed)
 SW added substance abuse resources to patient's AVS in Spanish.   .Warden Buffa, MSW, LCSWA Transition of Care  Clinical Social Worker (ED 3-11 Mon-Fri)  [phone]

## 2024-05-03 NOTE — Plan of Care (Signed)
 Pt arrived to unit from ED. Oriented to unit and room. Wife at bedside. Discussed plan for the evening including O2 management and CIWA assessments. Reviewed safety precautions and use of call light. Discussed CBG and sliding scale coverage. Pt and wife did not have any further questions. VSS, will continue to monitor.

## 2024-05-03 NOTE — Progress Notes (Signed)
 PROGRESS NOTE    Fredi Geiler  ZHY:[id number] DOB: 07-08-1976 DOA: 05/02/2024 PCP: Patient, No Pcp Per     Brief Narrative:   From admission h and p   Bobby Thompson is a 48 y.o. male with medical history significant for type 2 diabetes mellitus, hyperlipidemia, CVA, anxiety, alcohol abuse, and cocaine abuse who presents with chest pain.   Patient has been trying to cut back on his alcohol use for the past 2 weeks with goal of quitting.  His last drink was 3 days ago.  Since then, he has developed chest discomfort with intermittent shortness of breath, nausea, and diaphoresis.  Chest discomfort does not worsen with activity and he is unable to identify any alleviating or exacerbating factors.  He has had a cough associated with this but no sputum production, fever, or chills.  He denies any lower extremity swelling or tenderness.   Assessment & Plan:   Principal Problem:   CAP (community acquired pneumonia) Active Problems:   History of stroke   Diabetes mellitus type 2, controlled (HCC)   Alcohol withdrawal (HCC)   Chest pain   Hyponatremia  # CAP Two days cough, here febrile with RLL infiltrate. HIV negative - continue ceftriaxone/azithromycin, will add flagyl given alcoholism - f/u blood cultures, sputum for culture - f/u procal - f/u urine antigens - will add covid and respiratory panel, also mrsa screen given alcohol abuse  # Sepsis By fever, tachycardia - f/u lactate - continue IVF  # Encephalopathy Mild, likely 2/2 infection, etoh withdrawal also possible - treat infection, continue fluids, monitor for withdrawal symptoms  # Alcohol abuse Heavy drinker, stopped drinking two weeks ago but had two beers on 6/13. If this is accurate, low risk of withdrawal - continue vitamins - CIWA protocol  # Hyponatremia With elevated urine osm and sodium, may be multifactorial - continue fluids, may have an element of hypovolemia given sepsis - check tsh and AM  cortisol this morning  # Chest pain Appears to be 2/2 pneumonia. Troponins mildly elevated and flat (18>20), bnp wnl, CTA no PE - monitor  # History CVA - cont home asa, statin  # HTN Here BPs wnl - holding home lisinopril  # T2DM - SSI  DVT prophylaxis: lovenox  Code Status: full Family Communication: wife updated @ bedside 6/16  Level of care: Progressive Status is: Observation    Consultants:  none  Procedures: none  Antimicrobials:  See above    Subjective: Reports feeling febrile, still with cough  Objective: Vitals:   05/03/24 0530 05/03/24 0600 05/03/24 0719 05/03/24 0724  BP: 115/66 129/73 132/80   Pulse: (!) 105 (!) 107 (!) 120   Resp: (!) 38 (!) 39    Temp:    99.6 F (37.6 C)  TempSrc:    Oral  SpO2: 97% 95%    Weight:      Height:        Intake/Output Summary (Last 24 hours) at 05/03/2024 0832 Last data filed at 05/03/2024 0548 Gross per 24 hour  Intake 419.72 ml  Output --  Net 419.72 ml   Filed Weights   05/02/24 1939  Weight: 90.7 kg    Examination:  General exam: Appears mildly ill Respiratory system: rales and rhonchi right lower and mid lung Cardiovascular system: S1 & S2 heard, tachycardic Gastrointestinal system: Abdomen is nondistended, soft and nontender.   Central nervous system: moving all 4 Extremities: Symmetric 5 x 5 power. Skin: No rashes, lesions or ulcers Psychiatry: mild confusion  Data Reviewed: I have personally reviewed following labs and imaging studies  CBC: Recent Labs  Lab 05/02/24 1946 05/03/24 0030  WBC 11.5* 10.8*  HGB 14.9 12.6*  HCT 42.5 36.2*  MCV 90.6 90.5  PLT 233 219   Basic Metabolic Panel: Recent Labs  Lab 05/02/24 1946 05/02/24 2200 05/03/24 0030  NA 129*  --  126*  K 3.7  --  3.5  CL 95*  --  95*  CO2 21*  --  22  GLUCOSE 136*  --  112*  BUN 14  --  10  CREATININE 0.80  --  0.80  CALCIUM  8.4*  --  7.5*  MG  --  1.6*  --    GFR: Estimated Creatinine Clearance:  123.9 mL/min (by C-G formula based on SCr of 0.8 mg/dL). Liver Function Tests: Recent Labs  Lab 05/03/24 0030  AST 48*  ALT 29  ALKPHOS 68  BILITOT 0.6  PROT 5.9*  ALBUMIN 2.5*   Recent Labs  Lab 05/03/24 0030  LIPASE 41   No results for input(s): AMMONIA in the last 168 hours. Coagulation Profile: No results for input(s): INR, PROTIME in the last 168 hours. Cardiac Enzymes: No results for input(s): CKTOTAL, CKMB, CKMBINDEX, TROPONINI in the last 168 hours. BNP (last 3 results) No results for input(s): PROBNP in the last 8760 hours. HbA1C: Recent Labs    05/03/24 0030  HGBA1C 6.3*   CBG: Recent Labs  Lab 05/02/24 2006 05/03/24 0024 05/03/24 0733  GLUCAP 137* 101* 99   Lipid Profile: No results for input(s): CHOL, HDL, LDLCALC, TRIG, CHOLHDL, LDLDIRECT in the last 72 hours. Thyroid Function Tests: No results for input(s): TSH, T4TOTAL, FREET4, T3FREE, THYROIDAB in the last 72 hours. Anemia Panel: No results for input(s): VITAMINB12, FOLATE, FERRITIN, TIBC, IRON, RETICCTPCT in the last 72 hours. Urine analysis: No results found for: COLORURINE, APPEARANCEUR, LABSPEC, PHURINE, GLUCOSEU, HGBUR, BILIRUBINUR, KETONESUR, PROTEINUR, UROBILINOGEN, NITRITE, LEUKOCYTESUR Sepsis Labs: @LABRCNTIP (procalcitonin:4,lacticidven:4)  )No results found for this or any previous visit (from the past 240 hours).       Radiology Studies: CT Angio Chest Pulmonary Embolism (PE) W or WO Contrast Result Date: 05/03/2024 EXAM: CTA of the Chest with contrast for PE 05/03/2024 02:45:57 AM TECHNIQUE: CTA of the chest was performed after the administration of intravenous contrast (75mL iohexol  (OMNIPAQUE ) 350 MG/ML injection). Multiplanar reformatted images are provided for review. MIP images are provided for review. Automated exposure control, iterative reconstruction, and/or weight based adjustment of the mA/kV was  utilized to reduce the radiation dose to as low as reasonably achievable. COMPARISON: Chest radiograph dated 05/02/2024. CLINICAL HISTORY: Pulmonary embolism (PE) suspected, low to intermediate prob, positive D-dimer. Chief complaints; Chest Pain; Shortness of Breath. FINDINGS: PULMONARY ARTERIES: Pulmonary arteries are adequately opacified for evaluation. No evidence of pulmonary embolism. Main pulmonary artery is normal in caliber. MEDIASTINUM: The heart and pericardium demonstrate no acute abnormality. Mild thoracic aortic atherosclerosis. There is no acute abnormality of the thoracic aorta. LYMPH NODES: No mediastinal, hilar or axillary lymphadenopathy. LUNGS AND PLEURA: Lobar right lower lobe pneumonia. No pleural effusion or pneumothorax. UPPER ABDOMEN: Limited images of the upper abdomen are unremarkable. SOFT TISSUES AND BONES: Moderate degenerative changes of the visualized thoracolumbar spine. No acute bone or soft tissue abnormality. IMPRESSION: 1. No evidence of pulmonary embolism. 2. Lobar right lower lobe pneumonia. Electronically signed by: Zadie Herter MD 05/03/2024 02:48 AM EDT RP Workstation: [id number]   DG Chest 2 View Result Date: 05/02/2024 CLINICAL DATA:  Chest pain, shortness of  breath EXAM: CHEST - 2 VIEW COMPARISON:  None Available. FINDINGS: Lungs are clear.  No pleural effusion or pneumothorax. The heart is normal in size. Moderate degenerative changes of the visualized thoracolumbar spine. IMPRESSION: Normal chest radiographs. Electronically Signed   By: Zadie Herter M.D.   On: 05/02/2024 20:30        Scheduled Meds:  aspirin  EC  81 mg Oral Daily   atorvastatin   20 mg Oral q1800   enoxaparin  (LOVENOX ) injection  40 mg Subcutaneous Daily   folic acid  1 mg Oral Daily   insulin aspart  0-5 Units Subcutaneous QHS   insulin aspart  0-6 Units Subcutaneous TID WC   LORazepam  0-4 mg Intravenous Q4H   Followed by   Cecily Cohen ON 05/05/2024] LORazepam  0-4 mg Intravenous  Q8H   multivitamin with minerals  1 tablet Oral Daily   sodium chloride  flush  3 mL Intravenous Q12H   thiamine  100 mg Oral Daily   Or   thiamine  100 mg Intravenous Daily   Continuous Infusions:  sodium chloride  100 mL/hr at 05/03/24 0026   azithromycin Stopped (05/03/24 0542)   cefTRIAXone (ROCEPHIN)  IV Stopped (05/03/24 0415)     LOS: 0 days     Raymonde Calico, MD [facility]   If 7PM-7AM, please contact night-coverage [email or web address] Password [facility] 05/03/2024, 8:32 AM

## 2024-05-03 NOTE — ED Notes (Signed)
 PT had a temp of 102.50F, PT given tylenol.

## 2024-05-03 NOTE — ED Notes (Signed)
Pt c/o pain and burning with urination.

## 2024-05-04 DIAGNOSIS — E876 Hypokalemia: Secondary | ICD-10-CM

## 2024-05-04 DIAGNOSIS — E66811 Obesity, class 1: Secondary | ICD-10-CM

## 2024-05-04 DIAGNOSIS — J9601 Acute respiratory failure with hypoxia: Secondary | ICD-10-CM

## 2024-05-04 DIAGNOSIS — J189 Pneumonia, unspecified organism: Principal | ICD-10-CM

## 2024-05-04 LAB — BASIC METABOLIC PANEL WITH GFR
Anion gap: 10 (ref 5–15)
BUN: 9 mg/dL (ref 6–20)
CO2: 22 mmol/L (ref 22–32)
Calcium: 8.2 mg/dL — ABNORMAL LOW (ref 8.9–10.3)
Chloride: 101 mmol/L (ref 98–111)
Creatinine, Ser: 0.67 mg/dL (ref 0.61–1.24)
GFR, Estimated: >60 mL/min (ref >60)
Glucose, Bld: 98 mg/dL (ref 70–99)
Potassium: 3.3 mmol/L — ABNORMAL LOW (ref 3.5–5.1)
Sodium: 133 mmol/L — ABNORMAL LOW (ref 135–145)

## 2024-05-04 LAB — PROCALCITONIN: Procalcitonin: 0.85 ng/mL

## 2024-05-04 LAB — CBC
HCT: 37.8 % — ABNORMAL LOW (ref 39.0–52.0)
Hemoglobin: 13.2 g/dL (ref 13.0–17.0)
MCH: 31.5 pg (ref 26.0–34.0)
MCHC: 34.9 g/dL (ref 30.0–36.0)
MCV: 90.2 fL (ref 80.0–100.0)
Platelets: 240 10*3/uL (ref 150–400)
RBC: 4.19 MIL/uL — ABNORMAL LOW (ref 4.22–5.81)
RDW: 12.8 % (ref 11.5–15.5)
WBC: 11.6 10*3/uL — ABNORMAL HIGH (ref 4.0–10.5)
nRBC: 0 % (ref 0.0–0.2)

## 2024-05-04 LAB — GLUCOSE, CAPILLARY
Glucose-Capillary: 108 mg/dL — ABNORMAL HIGH (ref 70–99)
Glucose-Capillary: 142 mg/dL — ABNORMAL HIGH (ref 70–99)

## 2024-05-04 LAB — LACTIC ACID, PLASMA: Lactic Acid, Venous: 1.2 mmol/L (ref 0.5–1.9)

## 2024-05-04 MED ORDER — ASPIRIN 81 MG PO TBEC: 81.0000 mg | DELAYED_RELEASE_TABLET | Freq: Every day | ORAL | Status: AC

## 2024-05-04 MED ORDER — NICOTINE 21 MG/24HR TD PT24
21.0000 mg | MEDICATED_PATCH | Freq: Every day | TRANSDERMAL | Status: DC
  Administered 2024-05-04: 21 mg via TRANSDERMAL
  Filled 2024-05-04: qty 1

## 2024-05-04 MED ORDER — FOLIC ACID 1 MG PO TABS: 1.0000 mg | ORAL_TABLET | Freq: Every day | ORAL | Status: AC

## 2024-05-04 MED ORDER — AZITHROMYCIN 500 MG PO TABS: 500.0000 mg | ORAL_TABLET | Freq: Every day | ORAL | 0 refills | Status: AC

## 2024-05-04 MED ORDER — POTASSIUM CHLORIDE CRYS ER 20 MEQ PO TBCR: 40.0000 meq | EXTENDED_RELEASE_TABLET | Freq: Once | ORAL | Status: DC

## 2024-05-04 MED ORDER — VITAMIN B-1 100 MG PO TABS: 100.0000 mg | ORAL_TABLET | Freq: Every day | ORAL | Status: AC

## 2024-05-04 MED ORDER — CEPHALEXIN 500 MG PO CAPS: 1000.0000 mg | ORAL_CAPSULE | Freq: Two times a day (BID) | ORAL | 0 refills | Status: AC

## 2024-05-04 MED ORDER — CEPHALEXIN 500 MG PO CAPS
1000.0000 mg | ORAL_CAPSULE | Freq: Two times a day (BID) | ORAL | Status: DC
  Administered 2024-05-04: 1000 mg via ORAL
  Filled 2024-05-04 (×2): qty 2

## 2024-05-04 NOTE — Assessment & Plan Note (Signed)
Body mass index is 32.56 kg/m.

## 2024-05-04 NOTE — Assessment & Plan Note (Addendum)
 05-04-2024 afebrile for 24 hours. WBC down to 11.6. strep pneumo negative. Tolerating PO diet. Change to po abx keflex 1000 mg bid x 7 days. Zithromax 500 mg qday x 3 days.  Had Rocephin x 2 doses and zithromax x 2 doses. Procal minimally elevated at 0.85.

## 2024-05-04 NOTE — Plan of Care (Signed)
  Problem: Coping: Goal: Ability to adjust to condition or change in health will improve Outcome: Adequate for Discharge   Problem: Activity: Goal: Ability to tolerate increased activity will improve Outcome: Adequate for Discharge   Problem: Respiratory: Goal: Ability to maintain adequate ventilation will improve Outcome: Adequate for Discharge

## 2024-05-04 NOTE — Progress Notes (Signed)
 PROGRESS NOTE    Wadsworth Skolnick  QIO:[id number] DOB: 31-Mar-1976 DOA: 05/02/2024 PCP: Patient, No Pcp Per  Subjective: Pt seen and examined. Wife at bedside. WBC down to 11K. Afebrile for 24 hours. Pt had RLL pneumonia in CTPA. Will need to take it easy at home while he recovers. Wife asks if he can be off work for 2 weeks. Work excuse written. Pt and wife would like to go home.   Hospital Course: HPI: Sky Borboa is a 48 y.o. male with medical history significant for type 2 diabetes mellitus, hyperlipidemia, CVA, anxiety, alcohol abuse, and cocaine abuse who presents with chest pain.   Patient has been trying to cut back on his alcohol use for the past 2 weeks with goal of quitting.  His last drink was 3 days ago.  Since then, he has developed chest discomfort with intermittent shortness of breath, nausea, and diaphoresis.  Chest discomfort does not worsen with activity and he is unable to identify any alleviating or exacerbating factors.  He has had a cough associated with this but no sputum production, fever, or chills.  He denies any lower extremity swelling or tenderness.  Significant Events: Admitted 05/02/2024 for etoh withdrawal   Admission Labs: WBC 11.5, HgB 14.9, plt 233 Na 129, K 3.7, CO2 of 21, BUN 14, scr 0.80, glu 136 UDS + THC BNP 29.1 Mg 1.6 D-dimer 3.48  Admission Imaging Studies: CXR Normal chest radiographs  CTPA No evidence of pulmonary embolism. 2. Lobar right lower lobe pneumonia.  Significant Labs: HgB 6.3% Cortisol 21.3 RVP negative TSH 2.669 Procal 0.85  Significant Imaging Studies:   Antibiotic Therapy: Anti-infectives (From admission, onward)    Start     Dose/Rate Route Frequency Ordered Stop   05/04/24 1000  azithromycin (ZITHROMAX) tablet 500 mg        500 mg Oral Daily 05/03/24 1049 05/08/24 0959   05/03/24 1000  metroNIDAZOLE (FLAGYL) tablet 500 mg        500 mg Oral Every 12 hours 05/03/24 0833     05/03/24 0315  cefTRIAXone  (ROCEPHIN) 2 g in sodium chloride  0.9 % 100 mL IVPB        2 g 200 mL/hr over 30 Minutes Intravenous Daily at bedtime 05/03/24 0307 05/07/24 2159   05/03/24 0315  azithromycin (ZITHROMAX) 500 mg in sodium chloride  0.9 % 250 mL IVPB  Status:  Discontinued        500 mg 250 mL/hr over 60 Minutes Intravenous Daily at bedtime 05/03/24 0307 05/03/24 1049       Procedures:   Consultants:     Assessment and Plan: * CAP (community acquired pneumonia) 05-04-2024 afebrile for 24 hours. WBC down to 11.6. strep pneumo negative. Tolerating PO diet. Change to po abx keflex 1000 mg bid x 7 days. Zithromax 500 mg qday x 3 days.  Had Rocephin x 2 doses and zithromax x 2 doses. Procal minimally elevated at 0.85.  Acute respiratory failure with hypoxia (HCC) 05-04-2024 RN noted pt to be hypoxic this AM with RA sats in the 80s. Home O2 evaluation.  Hypokalemia 05-04-2024 give po kcl prior to DC today.  Hyponatremia 05-04-2024 stable. Na up to 133.  Likely due to etoh consumption.  Chest pain 05-04-2024 due to his RLL pneumonia.  Alcohol withdrawal (HCC) 05-04-2024 stable. Pt advised to stop all ETOH consumption. Wife present during this converation.  Diabetes mellitus type 2, controlled (HCC) 05-04-2024 stable. Continue metformin  at home.  History of stroke 05-04-2024 stable. Continue ASA  81 mg daily.  Obesity, Class I, BMI 30-34.9 Body mass index is 32.56 kg/m.   DVT prophylaxis: enoxaparin  (LOVENOX ) injection 40 mg Start: 05/03/24 1000    Code Status: Full Code Family Communication: discussed with pt and wife. No smoking or drinking of etoh Disposition Plan: return home  Reason for continuing need for hospitalization: stable for DC. On RA. No fevers. Tolerating PO diet. No use of ativan in 24 hours. Has reached maximum hospital benefit.  Objective: Vitals:   05/04/24 0727 05/04/24 0830 05/04/24 0840 05/04/24 1207  BP: 120/78   124/69  Pulse: 86   89  Resp: (!) 26   18   Temp: 99.5 F (37.5 C)   (!) 97.5 F (36.4 C)  TempSrc: Oral   Oral  SpO2: 98% (!) 86% 94% 96%  Weight:      Height:        Intake/Output Summary (Last 24 hours) at 05/04/2024 1410 Last data filed at 05/04/2024 0823 Gross per 24 hour  Intake 1240 ml  Output 900 ml  Net 340 ml   Filed Weights   05/02/24 1939 05/03/24 1940  Weight: 90.7 kg 94.3 kg    Examination:  Physical Exam Vitals and nursing note reviewed.  Constitutional:      Appearance: He is obese.  HENT:     Head: Normocephalic.     Nose: Nose normal.   Eyes:     General: No scleral icterus.   Cardiovascular:     Rate and Rhythm: Normal rate and regular rhythm.  Pulmonary:     Effort: Pulmonary effort is normal. No respiratory distress.     Breath sounds: Examination of the right-lower field reveals rales. Rales present.     Comments: No distress Abdominal:     General: Abdomen is protuberant. There is no distension.     Palpations: Abdomen is soft.     Tenderness: There is no abdominal tenderness.   Musculoskeletal:     Right lower leg: No edema.     Left lower leg: No edema.   Skin:    General: Skin is warm and dry.     Capillary Refill: Capillary refill takes less than 2 seconds.   Neurological:     Mental Status: He is alert and oriented to person, place, and time.     Data Reviewed: I have personally reviewed following labs and imaging studies  CBC: Recent Labs  Lab 05/02/24 1946 05/03/24 0030 05/04/24 0553  WBC 11.5* 10.8* 11.6*  HGB 14.9 12.6* 13.2  HCT 42.5 36.2* 37.8*  MCV 90.6 90.5 90.2  PLT 233 219 240   Basic Metabolic Panel: Recent Labs  Lab 05/02/24 1946 05/02/24 2200 05/03/24 0030 05/04/24 0553  NA 129*  --  126* 133*  K 3.7  --  3.5 3.3*  CL 95*  --  95* 101  CO2 21*  --  22 22  GLUCOSE 136*  --  112* 98  BUN 14  --  10 9  CREATININE 0.80  --  0.80 0.67  CALCIUM  8.4*  --  7.5* 8.2*  MG  --  1.6*  --   --    GFR: Estimated Creatinine Clearance: 126.3  mL/min (by C-G formula based on SCr of 0.67 mg/dL). Liver Function Tests: Recent Labs  Lab 05/03/24 0030  AST 48*  ALT 29  ALKPHOS 68  BILITOT 0.6  PROT 5.9*  ALBUMIN 2.5*   Recent Labs  Lab 05/03/24 0030  LIPASE 41   BNP (  last 3 results) Recent Labs    05/02/24 1946  BNP 29.1   HbA1C: Recent Labs    05/03/24 0030  HGBA1C 6.3*   CBG: Recent Labs  Lab 05/03/24 1212 05/03/24 1731 05/03/24 2103 05/04/24 0726 05/04/24 1205  GLUCAP 111* 141* 101* 108* 142*   Thyroid Function Tests: Recent Labs    05/03/24 0030  TSH 2.669   Sepsis Labs: Recent Labs  Lab 05/04/24 0553  PROCALCITON 0.85  LATICACIDVEN 1.2    Recent Results (from the past 240 hours)  Culture, blood (Routine X 2) w Reflex to ID Panel     Status: None (Preliminary result)   Collection Time: 05/03/24  3:28 AM   Specimen: BLOOD  Result Value Ref Range Status   Specimen Description BLOOD BLOOD LEFT HAND  Final   Special Requests   Final    BOTTLES DRAWN AEROBIC AND ANAEROBIC Blood Culture adequate volume   Culture   Final    NO GROWTH 1 DAY Performed at [facility], [address], [place], [place] [address]    Report Status PENDING  Incomplete  Culture, blood (Routine X 2) w Reflex to ID Panel     Status: None (Preliminary result)   Collection Time: 05/03/24  3:36 AM   Specimen: BLOOD  Result Value Ref Range Status   Specimen Description BLOOD BLOOD LEFT ARM  Final   Special Requests   Final    BOTTLES DRAWN AEROBIC AND ANAEROBIC Blood Culture adequate volume   Culture   Final    NO GROWTH 1 DAY Performed at [facility], [address] [place], [place] [address]    Report Status PENDING  Incomplete  Respiratory (~20 pathogens) panel by PCR     Status: None   Collection Time: 05/03/24  8:18 AM   Specimen: Anterior Nasal Swab; Respiratory  Result Value Ref Range Status   Adenovirus NOT DETECTED NOT DETECTED Final   Coronavirus 229E NOT DETECTED NOT DETECTED Final     Comment: (NOTE) The Coronavirus on the Respiratory Panel, DOES NOT test for the novel  Coronavirus (2019 nCoV)    Coronavirus HKU1 NOT DETECTED NOT DETECTED Final   Coronavirus NL63 NOT DETECTED NOT DETECTED Final   Coronavirus OC43 NOT DETECTED NOT DETECTED Final   Metapneumovirus NOT DETECTED NOT DETECTED Final   Rhinovirus / Enterovirus NOT DETECTED NOT DETECTED Final   Influenza A NOT DETECTED NOT DETECTED Final   Influenza B NOT DETECTED NOT DETECTED Final   Parainfluenza Virus 1 NOT DETECTED NOT DETECTED Final   Parainfluenza Virus 2 NOT DETECTED NOT DETECTED Final   Parainfluenza Virus 3 NOT DETECTED NOT DETECTED Final   Parainfluenza Virus 4 NOT DETECTED NOT DETECTED Final   Respiratory Syncytial Virus NOT DETECTED NOT DETECTED Final   Bordetella pertussis NOT DETECTED NOT DETECTED Final   Bordetella Parapertussis NOT DETECTED NOT DETECTED Final   Chlamydophila pneumoniae NOT DETECTED NOT DETECTED Final   Mycoplasma pneumoniae NOT DETECTED NOT DETECTED Final    Comment: Performed at [facility], [address] [place], [place] [address]  SARS Coronavirus 2 by RT PCR (hospital order, performed in [facility] hospital lab) *cepheid single result test* Anterior Nasal Swab     Status: None   Collection Time: 05/03/24  8:18 AM   Specimen: Anterior Nasal Swab  Result Value Ref Range Status   SARS Coronavirus 2 by RT PCR NEGATIVE NEGATIVE Final    Comment: Performed at [facility],  [address] [place], [place] [address]     Radiology Studies: CT Angio Chest Pulmonary Embolism (PE) W or WO Contrast Result Date: 05/03/2024 EXAM: CTA of the Chest with contrast for PE 05/03/2024 02:45:57 AM TECHNIQUE: CTA of the chest was performed after the administration of intravenous contrast (75mL iohexol  (OMNIPAQUE ) 350 MG/ML injection). Multiplanar reformatted images are provided for review. MIP images are provided for review. Automated exposure control, iterative reconstruction,  and/or weight based adjustment of the mA/kV was utilized to reduce the radiation dose to as low as reasonably achievable. COMPARISON: Chest radiograph dated 05/02/2024. CLINICAL HISTORY: Pulmonary embolism (PE) suspected, low to intermediate prob, positive D-dimer. Chief complaints; Chest Pain; Shortness of Breath. FINDINGS: PULMONARY ARTERIES: Pulmonary arteries are adequately opacified for evaluation. No evidence of pulmonary embolism. Main pulmonary artery is normal in caliber. MEDIASTINUM: The heart and pericardium demonstrate no acute abnormality. Mild thoracic aortic atherosclerosis. There is no acute abnormality of the thoracic aorta. LYMPH NODES: No mediastinal, hilar or axillary lymphadenopathy. LUNGS AND PLEURA: Lobar right lower lobe pneumonia. No pleural effusion or pneumothorax. UPPER ABDOMEN: Limited images of the upper abdomen are unremarkable. SOFT TISSUES AND BONES: Moderate degenerative changes of the visualized thoracolumbar spine. No acute bone or soft tissue abnormality. IMPRESSION: 1. No evidence of pulmonary embolism. 2. Lobar right lower lobe pneumonia. Electronically signed by: Zadie Herter MD 05/03/2024 02:48 AM EDT RP Workstation: [id number]   DG Chest 2 View Result Date: 05/02/2024 CLINICAL DATA:  Chest pain, shortness of breath EXAM: CHEST - 2 VIEW COMPARISON:  None Available. FINDINGS: Lungs are clear.  No pleural effusion or pneumothorax. The heart is normal in size. Moderate degenerative changes of the visualized thoracolumbar spine. IMPRESSION: Normal chest radiographs. Electronically Signed   By: Zadie Herter M.D.   On: 05/02/2024 20:30    Scheduled Meds:  aspirin  EC  81 mg Oral Daily   atorvastatin   20 mg Oral q1800   azithromycin  500 mg Oral Daily   cephALEXin  1,000 mg Oral Q12H   enoxaparin  (LOVENOX ) injection  40 mg Subcutaneous Daily   folic acid  1 mg Oral Daily   insulin aspart  0-5 Units Subcutaneous QHS   insulin aspart  0-6 Units Subcutaneous TID  WC   LORazepam  0-4 mg Intravenous Q4H   Followed by   Cecily Cohen ON 05/05/2024] LORazepam  0-4 mg Intravenous Q8H   multivitamin with minerals  1 tablet Oral Daily   nicotine   21 mg Transdermal Daily   potassium chloride  40 mEq Oral Once   sodium chloride  flush  3 mL Intravenous Q12H   thiamine  100 mg Oral Daily   Or   thiamine  100 mg Intravenous Daily   Continuous Infusions:   LOS: 1 day   Time spent: 50 minutes  Unk Garb, DO  [facility]  05/04/2024, 2:10 PM

## 2024-05-04 NOTE — Assessment & Plan Note (Signed)
 05-04-2024 due to his RLL pneumonia.

## 2024-05-04 NOTE — Assessment & Plan Note (Signed)
 05-04-2024 stable. Na up to 133.  Likely due to etoh consumption.

## 2024-05-04 NOTE — TOC CM/SW Note (Addendum)
 05-04-24 1422 Case Manager covering this patient for today. Case Manager received a consult for home oxygen. Staff RN has charted that the patient is satting 94% at rest and with ambulation. Staff RN has stated patient will not need oxygen for home. 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 Family to assist with scheduling an appointment-discharge order is in EPIC. No further needs identified at this time.

## 2024-05-04 NOTE — Progress Notes (Addendum)
 SATURATION QUALIFICATIONS: (This note is used to comply with regulatory documentation for home oxygen)  Patient Saturations on Room Air at Rest = 94%  Patient Saturations on Room Air while Ambulating = 94-97%  Although patient maintaining sat on room air, but appears with shortness of breath and wobbly during ambulation. MD aware.

## 2024-05-04 NOTE — Subjective & Objective (Signed)
 Pt seen and examined. Wife at bedside. WBC down to 11K. Afebrile for 24 hours. Pt had RLL pneumonia in CTPA. Will need to take it easy at home while he recovers. Wife asks if he can be off work for 2 weeks. Work excuse written. Pt and wife would like to go home.

## 2024-05-04 NOTE — Assessment & Plan Note (Signed)
 05-04-2024 stable. Continue metformin  at home.

## 2024-05-04 NOTE — Assessment & Plan Note (Signed)
 05-04-2024 stable. Continue ASA 81 mg daily.

## 2024-05-04 NOTE — Hospital Course (Signed)
 HPI: Bobby Thompson is a 48 y.o. male with medical history significant for type 2 diabetes mellitus, hyperlipidemia, CVA, anxiety, alcohol abuse, and cocaine abuse who presents with chest pain.   Patient has been trying to cut back on his alcohol use for the past 2 weeks with goal of quitting.  His last drink was 3 days ago.  Since then, he has developed chest discomfort with intermittent shortness of breath, nausea, and diaphoresis.  Chest discomfort does not worsen with activity and he is unable to identify any alleviating or exacerbating factors.  He has had a cough associated with this but no sputum production, fever, or chills.  He denies any lower extremity swelling or tenderness.  Significant Events: Admitted 05/02/2024 for etoh withdrawal   Admission Labs: WBC 11.5, HgB 14.9, plt 233 Na 129, K 3.7, CO2 of 21, BUN 14, scr 0.80, glu 136 UDS + THC BNP 29.1 Mg 1.6 D-dimer 3.48  Admission Imaging Studies: CXR Normal chest radiographs  CTPA No evidence of pulmonary embolism. 2. Lobar right lower lobe pneumonia.  Significant Labs: HgB 6.3% Cortisol 21.3 RVP negative TSH 2.669 Procal 0.85  Significant Imaging Studies:   Antibiotic Therapy: Anti-infectives (From admission, onward)    Start     Dose/Rate Route Frequency Ordered Stop   05/04/24 1000  azithromycin (ZITHROMAX) tablet 500 mg        500 mg Oral Daily 05/03/24 1049 05/08/24 0959   05/03/24 1000  metroNIDAZOLE (FLAGYL) tablet 500 mg        500 mg Oral Every 12 hours 05/03/24 0833     05/03/24 0315  cefTRIAXone (ROCEPHIN) 2 g in sodium chloride  0.9 % 100 mL IVPB        2 g 200 mL/hr over 30 Minutes Intravenous Daily at bedtime 05/03/24 0307 05/07/24 2159   05/03/24 0315  azithromycin (ZITHROMAX) 500 mg in sodium chloride  0.9 % 250 mL IVPB  Status:  Discontinued        500 mg 250 mL/hr over 60 Minutes Intravenous Daily at bedtime 05/03/24 0307 05/03/24 1049       Procedures:   Consultants:

## 2024-05-04 NOTE — Discharge Summary (Signed)
 [facility] Physician Discharge Summary   Patient name: Bobby Thompson  Admit date:     05/02/2024  Discharge date: 05/04/2024  Attending Physician: Janeane Mealy [[id number]]  Discharge Physician: Unk Garb   PCP: Patient, No Pcp Per  Admitted From: Home  Disposition:  Home  Recommendations for Outpatient Follow-up:  Follow up with PCP in 1-2 weeks Please follow up on the following pending results: blood cultures  Home Health:No Equipment/Devices: None    Discharge Condition:Stable CODE STATUS:FULL Diet recommendation: Diabetic Fluid Restriction: None  Hospital Summary: HPI: Bobby Thompson is a 48 y.o. male with medical history significant for type 2 diabetes mellitus, hyperlipidemia, CVA, anxiety, alcohol abuse, and cocaine abuse who presents with chest pain.   Patient has been trying to cut back on his alcohol use for the past 2 weeks with goal of quitting.  His last drink was 3 days ago.  Since then, he has developed chest discomfort with intermittent shortness of breath, nausea, and diaphoresis.  Chest discomfort does not worsen with activity and he is unable to identify any alleviating or exacerbating factors.  He has had a cough associated with this but no sputum production, fever, or chills.  He denies any lower extremity swelling or tenderness.  Significant Events: Admitted 05/02/2024 for etoh withdrawal   Admission Labs: WBC 11.5, HgB 14.9, plt 233 Na 129, K 3.7, CO2 of 21, BUN 14, scr 0.80, glu 136 UDS + THC BNP 29.1 Mg 1.6 D-dimer 3.48  Admission Imaging Studies: CXR Normal chest radiographs  CTPA No evidence of pulmonary embolism. 2. Lobar right lower lobe pneumonia.  Significant Labs: HgB 6.3% Cortisol 21.3 RVP negative TSH 2.669 Procal 0.85  Significant Imaging Studies:   Antibiotic Therapy: Anti-infectives (From admission, onward)    Start     Dose/Rate Route Frequency Ordered Stop   05/04/24 1000  azithromycin (ZITHROMAX)  tablet 500 mg        500 mg Oral Daily 05/03/24 1049 05/08/24 0959   05/03/24 1000  metroNIDAZOLE (FLAGYL) tablet 500 mg        500 mg Oral Every 12 hours 05/03/24 0833     05/03/24 0315  cefTRIAXone (ROCEPHIN) 2 g in sodium chloride  0.9 % 100 mL IVPB        2 g 200 mL/hr over 30 Minutes Intravenous Daily at bedtime 05/03/24 0307 05/07/24 2159   05/03/24 0315  azithromycin (ZITHROMAX) 500 mg in sodium chloride  0.9 % 250 mL IVPB  Status:  Discontinued        500 mg 250 mL/hr over 60 Minutes Intravenous Daily at bedtime 05/03/24 0307 05/03/24 1049       Procedures:   Consultants:    Hospital Course by Problem: * CAP (community acquired pneumonia) 05-04-2024 afebrile for 24 hours. WBC down to 11.6. strep pneumo negative. Tolerating PO diet. Change to po abx keflex 1000 mg bid x 7 days. Zithromax 500 mg qday x 3 days.  Had Rocephin x 2 doses and zithromax x 2 doses. Procal minimally elevated at 0.85.  Acute respiratory failure with hypoxia (HCC) 05-04-2024 RN noted pt to be hypoxic this AM with RA sats in the 80s. Home O2 evaluation.  *update. Pt weaned to 94% on RA by RN.  Hypokalemia 05-04-2024 give po kcl prior to DC today.  Hyponatremia 05-04-2024 stable. Na up to 133.  Likely due to etoh consumption.  Chest pain 05-04-2024 due to his RLL pneumonia.  Alcohol withdrawal (HCC) 05-04-2024 stable. Pt advised to stop all ETOH consumption.  Wife present during this converation.  Diabetes mellitus type 2, controlled (HCC) 05-04-2024 stable. Continue metformin  at home.  History of stroke 05-04-2024 stable. Continue ASA 81 mg daily.  Obesity, Class I, BMI 30-34.9 Body mass index is 32.56 kg/m.     Discharge Diagnoses:  Principal Problem:   CAP (community acquired pneumonia) Active Problems:   Acute respiratory failure with hypoxia (HCC)   History of stroke   Diabetes mellitus type 2, controlled (HCC)   Alcohol withdrawal (HCC)   Chest pain   Hyponatremia    Hypokalemia   Obesity, Class I, BMI 30-34.9   Discharge Instructions  Discharge Instructions     Call MD for:  difficulty breathing, headache or visual disturbances   Complete by: As directed    Call MD for:  extreme fatigue   Complete by: As directed    Call MD for:  hives   Complete by: As directed    Call MD for:  persistant dizziness or light-headedness   Complete by: As directed    Call MD for:  persistant nausea and vomiting   Complete by: As directed    Call MD for:  redness, tenderness, or signs of infection (pain, swelling, redness, odor or green/yellow discharge around incision site)   Complete by: As directed    Call MD for:  severe uncontrolled pain   Complete by: As directed    Call MD for:  temperature >100.4   Complete by: As directed    Diet Carb Modified   Complete by: As directed    Discharge instructions   Complete by: As directed    1. Follow up with your primary care provider in 1-2 weeks following discharge from hospital. 2. Do Not smoke cigarettes/tobacco while taking antibiotics for your pneumonia. It would be better to quit smoking entirely. 3. Stop drinking alcohol   Increase activity slowly   Complete by: As directed       Allergies as of 05/04/2024   No Known Allergies      Medication List     STOP taking these medications    ibuprofen 200 MG tablet Commonly known as: ADVIL       TAKE these medications    aspirin  EC 81 MG tablet Take 1 tablet (81 mg total) by mouth daily.   azithromycin 500 MG tablet Commonly known as: ZITHROMAX Take 1 tablet (500 mg total) by mouth daily for 3 days. Start taking on: May 05, 2024   cephALEXin 500 MG capsule Commonly known as: KEFLEX Take 2 capsules (1,000 mg total) by mouth every 12 (twelve) hours for 7 days.   folic acid 1 MG tablet Commonly known as: FOLVITE Take 1 tablet (1 mg total) by mouth daily. Start taking on: May 05, 2024   lisinopril 20 MG tablet Commonly known as:  ZESTRIL Take 20 mg by mouth daily at 12 noon.   metFORMIN  1000 MG tablet Commonly known as: GLUCOPHAGE  Take 1,000 mg by mouth 2 (two) times daily.   sildenafil 100 MG tablet Commonly known as: VIAGRA Take 100 mg by mouth daily as needed.   simvastatin 20 MG tablet Commonly known as: ZOCOR Take 20 mg by mouth daily at 12 noon.   thiamine 100 MG tablet Commonly known as: Vitamin B-1 Take 1 tablet (100 mg total) by mouth daily. Start taking on: May 05, 2024               Durable Medical Equipment  (From admission, onward)  Start     Ordered   05/04/24 1401  For home use only DME oxygen  Once       Question Answer Comment  Length of Need 6 Months   Mode or (Route) Nasal cannula   Liters per Minute 2   Frequency Continuous (stationary and portable oxygen unit needed)   Oxygen conserving device Yes   Oxygen delivery system Gas      05/04/24 1400            No Known Allergies  Discharge Exam: Vitals:   05/04/24 0840 05/04/24 1207  BP:  124/69  Pulse:  89  Resp:  18  Temp:  (!) 97.5 F (36.4 C)  SpO2: 94% 96%    Physical Exam  The results of significant diagnostics from this hospitalization (including imaging, microbiology, ancillary and laboratory) are listed below for reference.    Microbiology: Recent Results (from the past 240 hours)  Culture, blood (Routine X 2) w Reflex to ID Panel     Status: None (Preliminary result)   Collection Time: 05/03/24  3:28 AM   Specimen: BLOOD  Result Value Ref Range Status   Specimen Description BLOOD BLOOD LEFT HAND  Final   Special Requests   Final    BOTTLES DRAWN AEROBIC AND ANAEROBIC Blood Culture adequate volume   Culture   Final    NO GROWTH 1 DAY Performed at [facility], [address] [place], [place] [address]    Report Status PENDING  Incomplete  Culture, blood (Routine X 2) w Reflex to ID Panel     Status: None (Preliminary result)   Collection Time: 05/03/24  3:36 AM    Specimen: BLOOD  Result Value Ref Range Status   Specimen Description BLOOD BLOOD LEFT ARM  Final   Special Requests   Final    BOTTLES DRAWN AEROBIC AND ANAEROBIC Blood Culture adequate volume   Culture   Final    NO GROWTH 1 DAY Performed at [facility], [address], [place], [place] [address]    Report Status PENDING  Incomplete  Respiratory (~20 pathogens) panel by PCR     Status: None   Collection Time: 05/03/24  8:18 AM   Specimen: Anterior Nasal Swab; Respiratory  Result Value Ref Range Status   Adenovirus NOT DETECTED NOT DETECTED Final   Coronavirus 229E NOT DETECTED NOT DETECTED Final    Comment: (NOTE) The Coronavirus on the Respiratory Panel, DOES NOT test for the novel  Coronavirus (2019 nCoV)    Coronavirus HKU1 NOT DETECTED NOT DETECTED Final   Coronavirus NL63 NOT DETECTED NOT DETECTED Final   Coronavirus OC43 NOT DETECTED NOT DETECTED Final   Metapneumovirus NOT DETECTED NOT DETECTED Final   Rhinovirus / Enterovirus NOT DETECTED NOT DETECTED Final   Influenza A NOT DETECTED NOT DETECTED Final   Influenza B NOT DETECTED NOT DETECTED Final   Parainfluenza Virus 1 NOT DETECTED NOT DETECTED Final   Parainfluenza Virus 2 NOT DETECTED NOT DETECTED Final   Parainfluenza Virus 3 NOT DETECTED NOT DETECTED Final   Parainfluenza Virus 4 NOT DETECTED NOT DETECTED Final   Respiratory Syncytial Virus NOT DETECTED NOT DETECTED Final   Bordetella pertussis NOT DETECTED NOT DETECTED Final   Bordetella Parapertussis NOT DETECTED NOT DETECTED Final   Chlamydophila pneumoniae NOT DETECTED NOT DETECTED Final   Mycoplasma pneumoniae NOT DETECTED NOT DETECTED Final    Comment: Performed at [facility], [address], [place], [place] [address]  SARS Coronavirus 2 by RT PCR (hospital order, performed in [facility] hospital lab) *cepheid single result test* Anterior Nasal Swab     Status: None   Collection Time: 05/03/24  8:18 AM   Specimen: Anterior Nasal Swab   Result Value Ref Range Status   SARS Coronavirus 2 by RT PCR NEGATIVE NEGATIVE Final    Comment: Performed at [facility], [address], [place], [place] [address]     Labs: BNP (last 3 results) Recent Labs    05/02/24 1946  BNP 29.1   Basic Metabolic Panel: Recent Labs  Lab 05/02/24 1946 05/02/24 2200 05/03/24 0030 05/04/24 0553  NA 129*  --  126* 133*  K 3.7  --  3.5 3.3*  CL 95*  --  95* 101  CO2 21*  --  22 22  GLUCOSE 136*  --  112* 98  BUN 14  --  10 9  CREATININE 0.80  --  0.80 0.67  CALCIUM  8.4*  --  7.5* 8.2*  MG  --  1.6*  --   --    Liver Function Tests: Recent Labs  Lab 05/03/24 0030  AST 48*  ALT 29  ALKPHOS 68  BILITOT 0.6  PROT 5.9*  ALBUMIN 2.5*   Recent Labs  Lab 05/03/24 0030  LIPASE 41    CBC: Recent Labs  Lab 05/02/24 1946 05/03/24 0030 05/04/24 0553  WBC 11.5* 10.8* 11.6*  HGB 14.9 12.6* 13.2  HCT 42.5 36.2* 37.8*  MCV 90.6 90.5 90.2  PLT 233 219 240   BNP: Recent Labs  Lab 05/02/24 1946  BNP 29.1   CBG: Recent Labs  Lab 05/03/24 1212 05/03/24 1731 05/03/24 2103 05/04/24 0726 05/04/24 1205  GLUCAP 111* 141* 101* 108* 142*   Cardiac Enzymes: Recent Labs  Lab 05/02/24 1946 05/02/24 2144  TROPONINIHS 18* 20*    D-Dimer Recent Labs    05/03/24 0030  DDIMER 3.48*   Hgb A1c Recent Labs    05/03/24 0030  HGBA1C 6.3*   Thyroid function studies Recent Labs    05/03/24 0030  TSH 2.669   Urinalysis    Component Value Date/Time   COLORURINE YELLOW 05/03/2024 1232   APPEARANCEUR CLEAR 05/03/2024 1232   LABSPEC 1.010 05/03/2024 1232   PHURINE 6.0 05/03/2024 1232   GLUCOSEU 100 (A) 05/03/2024 1232   HGBUR NEGATIVE 05/03/2024 1232   BILIRUBINUR NEGATIVE 05/03/2024 1232   KETONESUR NEGATIVE 05/03/2024 1232   PROTEINUR NEGATIVE 05/03/2024 1232   NITRITE NEGATIVE 05/03/2024 1232   LEUKOCYTESUR NEGATIVE 05/03/2024 1232   Sepsis Labs Recent Labs  Lab 05/02/24 1946 05/03/24 0030  05/04/24 0553  PROCALCITON  --   --  0.85  WBC 11.5* 10.8* 11.6*    Procedures/Studies: CT Angio Chest Pulmonary Embolism (PE) W or WO Contrast Result Date: 05/03/2024 EXAM: CTA of the Chest with contrast for PE 05/03/2024 02:45:57 AM TECHNIQUE: CTA of the chest was performed after the administration of intravenous contrast (75mL iohexol  (OMNIPAQUE ) 350 MG/ML injection). Multiplanar reformatted images are provided for review. MIP images are provided for review. Automated exposure control, iterative reconstruction, and/or weight based adjustment of the mA/kV was utilized to reduce the radiation dose to as low as reasonably achievable. COMPARISON: Chest radiograph dated 05/02/2024. CLINICAL HISTORY: Pulmonary embolism (PE) suspected, low to intermediate prob, positive D-dimer. Chief complaints; Chest Pain; Shortness of Breath. FINDINGS: PULMONARY ARTERIES: Pulmonary arteries are adequately opacified for evaluation. No evidence of pulmonary embolism. Main pulmonary artery is normal in caliber. MEDIASTINUM:  The heart and pericardium demonstrate no acute abnormality. Mild thoracic aortic atherosclerosis. There is no acute abnormality of the thoracic aorta. LYMPH NODES: No mediastinal, hilar or axillary lymphadenopathy. LUNGS AND PLEURA: Lobar right lower lobe pneumonia. No pleural effusion or pneumothorax. UPPER ABDOMEN: Limited images of the upper abdomen are unremarkable. SOFT TISSUES AND BONES: Moderate degenerative changes of the visualized thoracolumbar spine. No acute bone or soft tissue abnormality. IMPRESSION: 1. No evidence of pulmonary embolism. 2. Lobar right lower lobe pneumonia. Electronically signed by: Zadie Herter MD 05/03/2024 02:48 AM EDT RP Workstation: [id number]   DG Chest 2 View Result Date: 05/02/2024 CLINICAL DATA:  Chest pain, shortness of breath EXAM: CHEST - 2 VIEW COMPARISON:  None Available. FINDINGS: Lungs are clear.  No pleural effusion or pneumothorax. The heart is normal in  size. Moderate degenerative changes of the visualized thoracolumbar spine. IMPRESSION: Normal chest radiographs. Electronically Signed   By: Zadie Herter M.D.   On: 05/02/2024 20:30    Time coordinating discharge: 55 mins  SIGNED:  Unk Garb, DO [facility] 05/04/24, 2:12 PM

## 2024-05-04 NOTE — Assessment & Plan Note (Signed)
 05-04-2024 give po kcl prior to DC today.

## 2024-05-04 NOTE — Assessment & Plan Note (Addendum)
 05-04-2024 RN noted pt to be hypoxic this AM with RA sats in the 80s. Home O2 evaluation.  *update. Pt weaned to 94% on RA by RN.

## 2024-05-04 NOTE — Assessment & Plan Note (Signed)
 05-04-2024 stable. Pt advised to stop all ETOH consumption. Wife present during this converation.

## 2024-05-05 ENCOUNTER — Telehealth (HOSPITAL_BASED_OUTPATIENT_CLINIC_OR_DEPARTMENT_OTHER): Payer: Self-pay | Admitting: *Deleted

## 2024-05-05 LAB — LEGIONELLA PNEUMOPHILA SEROGP 1 UR AG: L. pneumophila Serogp 1 Ur Ag: NEGATIVE

## 2024-05-06 ENCOUNTER — Encounter (HOSPITAL_COMMUNITY): Payer: Self-pay

## 2024-05-08 LAB — CULTURE, BLOOD (ROUTINE X 2)
Culture: NO GROWTH
Culture: NO GROWTH
Special Requests: ADEQUATE
Special Requests: ADEQUATE

## 2024-05-15 ENCOUNTER — Emergency Department (HOSPITAL_COMMUNITY)
Admission: EM | Admit: 2024-05-15 | Discharge: 2024-05-15 | Disposition: A | Discharge status: Home / Self Care | Payer: Self-pay | Attending: Emergency Medicine | Admitting: Emergency Medicine

## 2024-05-15 ENCOUNTER — Emergency Department (HOSPITAL_COMMUNITY): Payer: Self-pay

## 2024-05-15 ENCOUNTER — Other Ambulatory Visit: Payer: Self-pay

## 2024-05-15 ENCOUNTER — Encounter (HOSPITAL_COMMUNITY): Payer: Self-pay

## 2024-05-15 DIAGNOSIS — R059 Cough, unspecified: Secondary | ICD-10-CM | POA: Insufficient documentation

## 2024-05-15 DIAGNOSIS — E119 Type 2 diabetes mellitus without complications: Secondary | ICD-10-CM | POA: Insufficient documentation

## 2024-05-15 DIAGNOSIS — Z7984 Long term (current) use of oral hypoglycemic drugs: Secondary | ICD-10-CM | POA: Insufficient documentation

## 2024-05-15 DIAGNOSIS — Z7982 Long term (current) use of aspirin: Secondary | ICD-10-CM | POA: Insufficient documentation

## 2024-05-15 DIAGNOSIS — R911 Solitary pulmonary nodule: Secondary | ICD-10-CM | POA: Insufficient documentation

## 2024-05-15 DIAGNOSIS — R0600 Dyspnea, unspecified: Secondary | ICD-10-CM | POA: Insufficient documentation

## 2024-05-15 LAB — CBC WITH DIFFERENTIAL/PLATELET
Abs Immature Granulocytes: 0.07 10*3/uL (ref 0.00–0.07)
Basophils Absolute: 0.1 10*3/uL (ref 0.0–0.1)
Basophils Relative: 1 %
Eosinophils Absolute: 0.1 10*3/uL (ref 0.0–0.5)
Eosinophils Relative: 2 %
HCT: 39.4 % (ref 39.0–52.0)
Hemoglobin: 13 g/dL (ref 13.0–17.0)
Immature Granulocytes: 1 %
Lymphocytes Relative: 26 %
Lymphs Abs: 2 10*3/uL (ref 0.7–4.0)
MCH: 31.6 pg (ref 26.0–34.0)
MCHC: 33 g/dL (ref 30.0–36.0)
MCV: 95.9 fL (ref 80.0–100.0)
Monocytes Absolute: 0.6 10*3/uL (ref 0.1–1.0)
Monocytes Relative: 8 %
Neutro Abs: 4.9 10*3/uL (ref 1.7–7.7)
Neutrophils Relative %: 62 %
Platelets: 504 10*3/uL — ABNORMAL HIGH (ref 150–400)
RBC: 4.11 MIL/uL — ABNORMAL LOW (ref 4.22–5.81)
RDW: 13 % (ref 11.5–15.5)
WBC: 7.8 10*3/uL (ref 4.0–10.5)
nRBC: 0 % (ref 0.0–0.2)

## 2024-05-15 LAB — TROPONIN I (HIGH SENSITIVITY)
Troponin I (High Sensitivity): 7 ng/L (ref <18)
Troponin I (High Sensitivity): 4 ng/L (ref <18)

## 2024-05-15 LAB — RESP PANEL BY RT-PCR (RSV, FLU A&B, COVID)  RVPGX2
Influenza A by PCR: NEGATIVE
Influenza B by PCR: NEGATIVE
Resp Syncytial Virus by PCR: NEGATIVE
SARS Coronavirus 2 by RT PCR: NEGATIVE

## 2024-05-15 LAB — COMPREHENSIVE METABOLIC PANEL WITH GFR
ALT: 17 U/L (ref 0–44)
AST: 18 U/L (ref 15–41)
Albumin: 3.1 g/dL — ABNORMAL LOW (ref 3.5–5.0)
Alkaline Phosphatase: 48 U/L (ref 38–126)
Anion gap: 12 (ref 5–15)
BUN: <5 mg/dL — ABNORMAL LOW (ref 6–20)
CO2: 19 mmol/L — ABNORMAL LOW (ref 22–32)
Calcium: 8.6 mg/dL — ABNORMAL LOW (ref 8.9–10.3)
Chloride: 107 mmol/L (ref 98–111)
Creatinine, Ser: 0.6 mg/dL — ABNORMAL LOW (ref 0.61–1.24)
GFR, Estimated: >60 mL/min (ref >60)
Glucose, Bld: 133 mg/dL — ABNORMAL HIGH (ref 70–99)
Potassium: 4 mmol/L (ref 3.5–5.1)
Sodium: 138 mmol/L (ref 135–145)
Total Bilirubin: 0.4 mg/dL (ref 0.0–1.2)
Total Protein: 6.1 g/dL — ABNORMAL LOW (ref 6.5–8.1)

## 2024-05-15 LAB — ETHANOL: Alcohol, Ethyl (B): 92 mg/dL — ABNORMAL HIGH (ref <15)

## 2024-05-15 LAB — D-DIMER, QUANTITATIVE: D-Dimer, Quant: 0.82 ug{FEU}/mL — ABNORMAL HIGH (ref 0.00–0.50)

## 2024-05-15 MED ORDER — IOHEXOL 350 MG/ML SOLN
75.0000 mL | Freq: Once | INTRAVENOUS | Status: AC | PRN
Start: 2024-05-15 — End: 2024-05-15
  Administered 2024-05-15: 75 mL via INTRAVENOUS

## 2024-05-15 MED ORDER — AMOXICILLIN-POT CLAVULANATE 875-125 MG PO TABS: 1.0000 | ORAL_TABLET | Freq: Two times a day (BID) | ORAL | 0 refills | Status: AC

## 2024-05-15 MED ORDER — DOXYCYCLINE HYCLATE 100 MG PO CAPS: 100.0000 mg | ORAL_CAPSULE | Freq: Two times a day (BID) | ORAL | 0 refills | Status: AC

## 2024-05-15 NOTE — Discharge Instructions (Addendum)
 Your testing is reassuring.  No evidence of heart attack or blood clot in the lung.  Your x-ray did show some residual pneumonia but is improving.  CT shows a pulmonary nodule, please see your PCP for repeat chest imaging in the next 3 to 6 months.  Take the additional course of antibiotics as prescribed and reduce your alcohol intake.  Return to the ED with difficulty breathing, chest pain, other concerns.

## 2024-05-15 NOTE — ED Provider Notes (Signed)
 [place] EMERGENCY DEPARTMENT AT [facility] Provider Note   CSN: [id number] Arrival date & time: 05/15/24  0241     Patient presents with: Shortness of Breath   Bobby Thompson is a 48 y.o. male.   Patient with history of diabetes, hyperlipidemia, alcohol abuse here with shortness of breath that onset while he is going to bed this evening.  Denies history of asthma or COPD does not smoke cigarettes.  Drinks about 5 or 6 beverages of alcohol daily he was recently hospitalized June 15 to 17 with pneumonia and alcohol withdrawal.  He reports finishing his antibiotics 4 days ago not having any issues until tonight when he went to go to sleep.  His shortness of breath has improved after getting oxygen by EMS.  Denies cough.  Denies chest pain.  Denies abdominal pain, nausea or vomiting.  Denies leg pain or leg swelling.  The history is provided by the patient and the EMS personnel.  Shortness of Breath Associated symptoms: cough   Associated symptoms: no abdominal pain, no chest pain, no fever, no headaches, no rash and no vomiting        Prior to Admission medications   Medication Sig Start Date End Date Taking? Authorizing Provider  aspirin  EC 81 MG tablet Take 1 tablet (81 mg total) by mouth daily. 05/04/24   Laurence Locus, DO  folic acid  (FOLVITE ) 1 MG tablet Take 1 tablet (1 mg total) by mouth daily. 05/05/24   Laurence Locus, DO  lisinopril (ZESTRIL) 20 MG tablet Take 20 mg by mouth daily at 12 noon. 04/03/24   [provider]  metFORMIN  (GLUCOPHAGE ) 1000 MG tablet Take 1,000 mg by mouth 2 (two) times daily. 04/03/24   [provider]  sildenafil (VIAGRA) 100 MG tablet Take 100 mg by mouth daily as needed. 04/03/24   [provider]  simvastatin (ZOCOR) 20 MG tablet Take 20 mg by mouth daily at 12 noon. 04/03/24   [provider]  thiamine  (VITAMIN B-1) 100 MG tablet Take 1 tablet (100 mg total) by mouth daily. 05/05/24   Laurence Locus, DO     Allergies: Patient has no known allergies.    Review of Systems  Constitutional:  Negative for activity change, appetite change and fever.  HENT:  Negative for congestion and rhinorrhea.   Respiratory:  Positive for cough and shortness of breath. Negative for chest tightness.   Cardiovascular:  Negative for chest pain.  Gastrointestinal:  Negative for abdominal pain, nausea and vomiting.  Genitourinary:  Negative for dysuria.  Musculoskeletal:  Negative for arthralgias and myalgias.  Skin:  Negative for rash.  Neurological:  Negative for dizziness, weakness and headaches.   all other systems are negative except as noted in the HPI and PMH.    Updated Vital Signs BP 120/81   Pulse 95   Temp 98.1 F (36.7 C)   Resp 18   Ht 5' 8 (1.727 m)   Wt 96.6 kg   SpO2 95%   BMI 32.39 kg/m   Physical Exam Vitals reviewed.  Constitutional:      General: He is not in acute distress.    Appearance: He is well-developed.     Comments: No distress. Speaking in full sentences  HENT:     Head: Normocephalic and atraumatic.     Mouth/Throat:     Pharynx: No oropharyngeal exudate.   Eyes:     Conjunctiva/sclera: Conjunctivae normal.     Pupils: Pupils are equal, round, and reactive  to light.   Neck:     Comments: No meningismus. Cardiovascular:     Rate and Rhythm: Normal rate and regular rhythm.     Heart sounds: Normal heart sounds. No murmur heard. Pulmonary:     Effort: Pulmonary effort is normal. No respiratory distress.     Breath sounds: Normal breath sounds.  Abdominal:     Palpations: Abdomen is soft.     Tenderness: There is no abdominal tenderness. There is no guarding or rebound.   Musculoskeletal:        General: No tenderness. Normal range of motion.     Cervical back: Normal range of motion and neck supple.   Skin:    General: Skin is warm.   Neurological:     Mental Status: He is alert and oriented to person, place, and time.     Cranial Nerves: No  cranial nerve deficit.     Motor: No abnormal muscle tone.     Coordination: Coordination normal.     Comments:  5/5 strength throughout. CN 2-12 intact.Equal grip strength.   Psychiatric:        Behavior: Behavior normal.     (all labs ordered are listed, but only abnormal results are displayed) Labs Reviewed  CBC WITH DIFFERENTIAL/PLATELET - Abnormal; Notable for the following components:      Result Value   RBC 4.11 (*)    Platelets 504 (*)    All other components within normal limits  COMPREHENSIVE METABOLIC PANEL WITH GFR - Abnormal; Notable for the following components:   CO2 19 (*)    Glucose, Bld 133 (*)    BUN <5 (*)    Creatinine, Ser 0.60 (*)    Calcium  8.6 (*)    Total Protein 6.1 (*)    Albumin 3.1 (*)    All other components within normal limits  ETHANOL - Abnormal; Notable for the following components:   Alcohol, Ethyl (B) 92 (*)    All other components within normal limits  D-DIMER, QUANTITATIVE - Abnormal; Notable for the following components:   D-Dimer, Quant 0.82 (*)    All other components within normal limits  RESP PANEL BY RT-PCR (RSV, FLU A&B, COVID)  [id number]  TROPONIN I (HIGH SENSITIVITY)  TROPONIN I (HIGH SENSITIVITY)    EKG: EKG Interpretation Date/Time:  Saturday May 15 2024 03:03:43 EDT Ventricular Rate:  96 PR Interval:  165 QRS Duration:  83 QT Interval:  327 QTC Calculation: 414 R Axis:   46  Text Interpretation: Sinus rhythm ST elev, probable normal early repol pattern No significant change was found Confirmed by Carita Senior [id number]) on 05/15/2024 3:36:02 AM  Radiology: CT Angio Chest PE W and/or Wo Contrast Result Date: 05/15/2024 CLINICAL DATA:  Shortness of breath, positive D-dimer. EXAM: CT ANGIOGRAPHY CHEST WITH CONTRAST TECHNIQUE: Multidetector CT imaging of the chest was performed using the standard protocol during bolus administration of intravenous contrast. Multiplanar CT image reconstructions and MIPs were obtained to  evaluate the vascular anatomy. RADIATION DOSE REDUCTION: This exam was performed according to the departmental dose-optimization program which includes automated exposure control, adjustment of the mA and/or kV according to patient size and/or use of iterative reconstruction technique. CONTRAST:  75mL OMNIPAQUE  IOHEXOL  350 MG/ML SOLN COMPARISON:  PA and lateral chest from today, PA and lateral chest 05/02/2024, CTA chest 05/03/2024. FINDINGS: Cardiovascular: There are age advanced three-vessel coronary artery calcifications. There may have been a prior stenting of the mid LAD. The cardiac size is normal. There is  no pericardial effusion. The pulmonary arteries and veins are normal caliber. No arterial embolism is seen.  Arterial opacification is diagnostic. There are scattered calcific plaques of the aorta without aneurysm, dissection or stenosis. The great vessels are clear and the descending aorta is tortuous. Mediastinum/Nodes: The esophagus is patulous and mildly diffusely thickened but no more than previously. Findings consistent with diffuse esophagitis. There are several mildly enlarged right hilar lymph nodes, largest is 1.4 cm in short axis on 5:72. These are unchanged and likely reactive. There are no enlarged mediastinal or axillary lymph nodes. The thoracic trachea, the main bronchi, lower poles of the thyroid are unremarkable. Lungs/Pleura: Dense patchy right lower lobe consolidation continues to be seen. Additional patchy airspace disease in the posterior segment of the right upper lobe is also still seen but has improved. Several nodules are present and will require follow-up but seem unchanged since June 16. They are better visualized today due to respiratory motion artifact on the previous study. Largest of these is a subsolid 1.2 cm subpleural nodule laterally in the lingula on 6:74. The solid component to this is 5 mm in size on 6:75. The next largest is an 8 mm roughly ovoid solid nodule in the  right middle lobe on 6:64. Smaller subcentimeter nodules include a 4 mm solid nodule in the left upper lobe laterally on 6:64, subpleural 5 mm left lower lobe nodule on 6:69, 7 mm ovoid right lower lobe nodule on 6:64, 6 mm solid nodule in in the right middle lobe on 6:70. No new infiltrate or new nodule are seen. No pleural effusion, thickening or pneumothorax. Upper Abdomen: There stones in the proximal gallbladder. No wall thickening. No acute upper abdominal findings. The liver mildly steatotic. Musculoskeletal: There is extensive thoracic spine bridging enthesopathy consistent with DISH, with multilevel degenerative discs. No acute or other significant osseous abnormality. There is mild bilateral dendritic gynecomastia. Review of the MIP images confirms the above findings. IMPRESSION: 1. No evidence of arterial dilatation or embolus. 2. Age advanced three-vessel coronary artery calcifications. 3. Dense patchy right lower lobe consolidation/pneumonia continues to be seen. Additional patchy airspace disease in the posterior segment of the right upper lobe is also still seen but has improved. 4. Multiple lung nodules, largest is a 1.2 cm subsolid nodule in the lingula. Non-contrast chest CT at 3-6 months is recommended. If nodules persist, subsequent management will be based upon the most suspicious nodule(s). This recommendation follows the consensus statement: Guidelines for Management of Incidental Pulmonary Nodules Detected on CT Images: From the Fleischner Society 2017; Radiology 2017; 284:228-243. 5. Patulous esophagus with mild diffuse thickening consistent with esophagitis. 6. Cholelithiasis. 7. Mild hepatic steatosis. 8. Dendritic gynecomastia. 9. Aortic atherosclerosis. Aortic Atherosclerosis (ICD10-I70.0). Electronically Signed   By: Francis Quam M.D.   On: 05/15/2024 07:42   DG Chest 2 View Result Date: 05/15/2024 CLINICAL DATA:  Shortness of breath EXAM: CHEST - 2 VIEW COMPARISON:  CT 05/03/2024  and chest radiograph from 05/02/2024. FINDINGS: Heart size and mediastinal contours are normal. No pleural effusion. Interval improvement in airspace disease within the right lung with residual opacity noted in the superior segment of the right lower lobe. Persistent small airspace opacity within the retrocardiac left lower lung. The visualized osseous structures are unremarkable., IMPRESSION: 1. Interval improvement in airspace disease within the right lung with residual opacity noted in the superior segment of the right lower lobe. 2. Persistent small airspace opacity within the retrocardiac left lower lung. Electronically Signed   By: Waddell  Joan M.D.   On: 05/15/2024 05:11     Procedures   Medications Ordered in the ED - No data to display  Clinical Course as of 05/15/24 0853  Sat May 15, 2024  0709 Handoff SR 48 yo/m Recent admit for pna/etoh DIB returned Still drinking CT pending [SG]  0742 CXR w/ improved opacity  [SG]  0751 CT has resulted, pneumonia is present but seems to have improved.  Nonacute observations are also noted including pulmonary nodules, recommend follow-up CT in 3 to 6 months [SG]    Clinical Course User Index [SG] Elnor Jayson LABOR, DO                                 Medical Decision Making Amount and/or Complexity of Data Reviewed Labs: ordered. Decision-making details documented in ED Course. Radiology: ordered and independent interpretation performed. Decision-making details documented in ED Course. ECG/medicine tests: ordered and independent interpretation performed. Decision-making details documented in ED Course.  Risk Prescription drug management.   Shortness of breath recent admission for pneumonia, now resolved.  No hypoxia or increased work of breathing.  Clear lungs  EKG is sinus rhythm. No wheezing.  Labs reassuring. Troponin negative, low suspicion for ACS.  CXR with improved R sided infiltrate. Persistent on L.  He told me he did  complete antibiotics but told nursing that he never did.   D-dimer is positive. CT to be obtained to rule out pulmonary embolism as source of dyspnea.  If negative, anticipate discharge home with additional course of antibiotics.  No hypoxia or increased work of breathing.  Dr. Elnor to assume care.      Final diagnoses:  Dyspnea, unspecified type  Pulmonary nodule    ED Discharge Orders     None          Kemal Amores, Garnette, MD 05/15/24 [phone]

## 2024-05-15 NOTE — ED Notes (Signed)
 Transported to CT

## 2024-05-15 NOTE — ED Triage Notes (Addendum)
 PT brought in by EMS for complaints of SOB, PT was in the hospital last week for PNE PT states he hasn't got any better and still feels SOB. PT was o room air at home per EMS and brought in on room air. PT alert and talking and states some chest pain when he coughs.  PT states not really taking ABX and states he had 7 beers tonight

## 2024-05-15 NOTE — ED Notes (Signed)
 Pt ambulatory to restroom

## 2024-05-15 NOTE — ED Provider Notes (Signed)
  Provider Note MRN:  [id number]  Arrival date & time: 05/15/24    ED Course and Medical Decision Making  Assumed care from Dr Carita at shift change.  See note from prior team for complete details, in brief:  Clinical Course as of 05/15/24 0757  Sat May 15, 2024  0709 Handoff SR 48 yo/m Recent admit for pna/etoh DIB returned Still drinking CT pending [SG]  0742 CXR w/ improved opacity  [SG]  0751 CT has resulted, pneumonia is present but seems to have improved.  Nonacute observations are also noted including pulmonary nodules, recommend follow-up CT in 3 to 6 months [SG]    Clinical Course User Index [SG] Elnor Jayson LABOR, DO   Patient reports he is feeling much better, no hypoxia.  Patient in no distress and overall condition is stable. Detailed discussions were had with the patient/guardian regarding current findings, and need for close f/u with PCP or on call doctor. The patient/guardian has been instructed to return immediately if the symptoms worsen in any way for re-evaluation. Patient/guardian verbalized understanding and is in agreement with current care plan. All questions answered prior to discharge.    Procedures  Final Clinical Impressions(s) / ED Diagnoses     ICD-10-CM   1. Dyspnea, unspecified type  R06.00     2. Pulmonary nodule  R91.1       ED Discharge Orders          Ordered    doxycycline (VIBRAMYCIN) 100 MG capsule  2 times daily        05/15/24 0718    amoxicillin-clavulanate (AUGMENTIN) 875-125 MG tablet  Every 12 hours        05/15/24 0754              Discharge Instructions      Your testing is reassuring.  No evidence of heart attack or blood clot in the lung.  Your x-ray did show some residual pneumonia but is improving.  CT shows a pulmonary nodule, please see your PCP for repeat chest imaging in the next 3 to 6 months.  Take the additional course of antibiotics as prescribed and reduce your alcohol intake.  Return to the ED with  difficulty breathing, chest pain, other concerns.        Elnor Jayson LABOR, DO 05/15/24 [phone]
